# Patient Record
Sex: Male | Born: 1965 | Race: Black or African American | Hispanic: No | Marital: Married | Smoking: Never smoker
Health system: Southern US, Community
[De-identification: ages and names within clinical notes are randomized; demographics above are authoritative.]

## PROBLEM LIST (undated history)

## (undated) DIAGNOSIS — I10 Essential (primary) hypertension: Secondary | ICD-10-CM

## (undated) HISTORY — PX: CIRCUMCISION: SUR203

---

## 1999-07-21 ENCOUNTER — Emergency Department (HOSPITAL_COMMUNITY): Admission: EM | Admit: 1999-07-21 | Discharge: 1999-07-21 | Payer: Self-pay | Admitting: Emergency Medicine

## 1999-07-21 ENCOUNTER — Encounter: Payer: Self-pay | Admitting: Emergency Medicine

## 2003-05-23 ENCOUNTER — Emergency Department (HOSPITAL_COMMUNITY): Admission: AD | Admit: 2003-05-23 | Discharge: 2003-05-23 | Payer: Self-pay | Admitting: Family Medicine

## 2006-01-18 ENCOUNTER — Emergency Department (HOSPITAL_COMMUNITY): Admission: EM | Admit: 2006-01-18 | Discharge: 2006-01-18 | Payer: Self-pay | Admitting: Emergency Medicine

## 2006-01-18 ENCOUNTER — Ambulatory Visit: Payer: Self-pay | Admitting: Family Medicine

## 2006-01-23 ENCOUNTER — Ambulatory Visit: Payer: Self-pay | Admitting: Family Medicine

## 2006-02-06 ENCOUNTER — Ambulatory Visit: Payer: Self-pay | Admitting: Family Medicine

## 2006-02-16 ENCOUNTER — Ambulatory Visit: Payer: Self-pay | Admitting: Gastroenterology

## 2006-02-26 ENCOUNTER — Ambulatory Visit: Payer: Self-pay | Admitting: Gastroenterology

## 2006-03-20 ENCOUNTER — Ambulatory Visit: Payer: Self-pay | Admitting: Gastroenterology

## 2007-08-30 ENCOUNTER — Ambulatory Visit: Payer: Self-pay | Admitting: Family Medicine

## 2009-04-28 ENCOUNTER — Emergency Department (HOSPITAL_COMMUNITY): Admission: EM | Admit: 2009-04-28 | Discharge: 2009-04-28 | Payer: Self-pay | Admitting: Emergency Medicine

## 2009-07-08 ENCOUNTER — Ambulatory Visit: Payer: Self-pay | Admitting: Family Medicine

## 2010-05-19 ENCOUNTER — Ambulatory Visit: Payer: Self-pay | Admitting: Family Medicine

## 2010-10-06 LAB — POCT I-STAT, CHEM 8
BUN: 12 mg/dL (ref 6–23)
Calcium, Ion: 1.15 mmol/L (ref 1.12–1.32)
Chloride: 101 mEq/L (ref 96–112)
Creatinine, Ser: 1.4 mg/dL (ref 0.4–1.5)
Glucose, Bld: 85 mg/dL (ref 70–99)
HCT: 45 % (ref 39.0–52.0)
Hemoglobin: 15.3 g/dL (ref 13.0–17.0)
Potassium: 4 mEq/L (ref 3.5–5.1)
Sodium: 137 mEq/L (ref 135–145)
TCO2: 27 mmol/L (ref 0–100)

## 2012-03-13 ENCOUNTER — Telehealth: Payer: Self-pay | Admitting: Internal Medicine

## 2012-03-13 NOTE — Telephone Encounter (Signed)
If he thinks its gas or acid, can try Zantac and avoid gas causing foods.  Hard to say without more info.     Of note, he hasn't been seen here by me, and no visit in at least 1 year.  Recommend CPX in general.

## 2012-03-13 NOTE — Telephone Encounter (Signed)
Pt notified to avoid greasy food and to try zantac and if not working come in for a OV. Pt was also notified that it has been since 2011 since he was seen that he needs to make an CPE visit in general. Pt will look at work schedule and work something out.

## 2012-03-13 NOTE — Telephone Encounter (Signed)
pt states that he bloating up in the stomach area after about an hour of laying down and seems to have to get up and grab some water for the pressure in stomach to go down. Pt wants to know if there is anything over the counter he can try first.

## 2013-02-20 ENCOUNTER — Encounter (HOSPITAL_COMMUNITY): Payer: Self-pay

## 2013-02-20 ENCOUNTER — Emergency Department (HOSPITAL_COMMUNITY)
Admission: EM | Admit: 2013-02-20 | Discharge: 2013-02-21 | Disposition: A | Discharge status: Home / Self Care | Payer: BC Managed Care – PPO | Attending: Emergency Medicine | Admitting: Emergency Medicine

## 2013-02-20 ENCOUNTER — Emergency Department (HOSPITAL_COMMUNITY): Payer: BC Managed Care – PPO

## 2013-02-20 DIAGNOSIS — X503XXA Overexertion from repetitive movements, initial encounter: Secondary | ICD-10-CM | POA: Insufficient documentation

## 2013-02-20 DIAGNOSIS — Y929 Unspecified place or not applicable: Secondary | ICD-10-CM | POA: Insufficient documentation

## 2013-02-20 DIAGNOSIS — R209 Unspecified disturbances of skin sensation: Secondary | ICD-10-CM | POA: Insufficient documentation

## 2013-02-20 DIAGNOSIS — T733XXA Exhaustion due to excessive exertion, initial encounter: Secondary | ICD-10-CM | POA: Insufficient documentation

## 2013-02-20 DIAGNOSIS — M25551 Pain in right hip: Secondary | ICD-10-CM

## 2013-02-20 DIAGNOSIS — Y9389 Activity, other specified: Secondary | ICD-10-CM | POA: Insufficient documentation

## 2013-02-20 DIAGNOSIS — M25559 Pain in unspecified hip: Secondary | ICD-10-CM | POA: Insufficient documentation

## 2013-02-20 DIAGNOSIS — R609 Edema, unspecified: Secondary | ICD-10-CM | POA: Insufficient documentation

## 2013-02-20 DIAGNOSIS — X500XXA Overexertion from strenuous movement or load, initial encounter: Secondary | ICD-10-CM | POA: Insufficient documentation

## 2013-02-20 NOTE — ED Notes (Signed)
Pt states that he is having bilateral hip pain that started yesterday, he just got back from a long driving trip, no injury noted, he also complains of his fingers swelling

## 2013-02-20 NOTE — ED Provider Notes (Signed)
CSN: [id number]     Arrival date & time 02/20/13  2055 History   This chart was scribed for Raymon Mutton, PA, working with Marisa Severin, MD by Blanchard Kelch, ED Scribe. This patient was seen in room WTR9/WTR9 and the patient's care was started at 11:07 PM.    Chief Complaint  Patient presents with  . Hip Pain  . Joint Swelling    The history is provided by the patient. No language interpreter was used.    HPI Comments: Curtis Wallace is a 47 y.o. male who presents to the Emergency Department complaining of non-radiating, constant, aching bilateral hip pain that began today. Patient had a long trip recently to help his daughter move into her dorm and was driving for about 9.5 hours yesterday as well as for 13 hours four days ago, as well as lifting boxes to help daughter move into dorm room. Pain is worsened with standing and sitting movements. Patient complains of associated finger swelling and tingling.Patient has consumed 4 bottles of water today and is well hydrated. 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 Patient denies any recent falls or injuries. He denies any chest pain, shortness of breath or difficulty breathing, abdominal pain, urinary or bowel incontinence, stool changes, dizziness, headache, numbness, swelling or weakness. Patient denies smoking. His PCP is Dr. Susann Givens.   History reviewed. No pertinent past medical history. History reviewed. No pertinent past surgical history. History reviewed. No pertinent family history. History  Substance Use Topics  . Smoking status: Never Smoker   . Smokeless tobacco: Not on file  . Alcohol Use: No    Review of Systems  Respiratory: Negative for shortness of breath.        No difficulty breathing.  Cardiovascular: Negative for chest pain and leg swelling.  Gastrointestinal: Negative for abdominal pain, diarrhea and constipation.  Genitourinary: Negative for dysuria and difficulty urinating.   Musculoskeletal: Negative for joint swelling.  Neurological: Negative for dizziness, weakness, numbness and headaches.       Tingling present in fingers.  All other systems reviewed and are negative.    Allergies  Review of patient's allergies indicates no known allergies.  Home Medications   Current Outpatient Rx  Name  Route  Sig  Dispense  Refill  . methocarbamol (ROBAXIN) 500 MG tablet   Oral   Take 1 tablet (500 mg total) by mouth 2 (two) times daily.   20 tablet   0   . naproxen (NAPROSYN) 500 MG tablet   Oral   Take 1 tablet (500 mg total) by mouth 2 (two) times daily.   30 tablet   0     Triage Vitals: BP 139/82  Pulse 74  Temp(Src) 98.7 F (37.1 C) (Oral)  Resp 20  SpO2 98%  Physical Exam  Nursing note and vitals reviewed. Constitutional: He is oriented to person, place, and time. He appears well-developed and well-nourished. No distress.  HENT:  Head: Normocephalic and atraumatic.  Mouth/Throat: Oropharynx is clear and moist. No oropharyngeal exudate.  Eyes: Conjunctivae and EOM are normal. Pupils are equal, round, and reactive to light. Right eye exhibits no discharge. Left eye exhibits no discharge.  Neck: Normal range of motion. Neck supple.  Negative neck stiffness Negative nuchal rigidity Negative pain upon palpation cervical spine  Cardiovascular: Normal rate, regular rhythm and normal heart sounds.  Exam reveals no friction rub.   No murmur heard. Pulses:      Radial pulses  are 2+ on the right side, and 2+ on the left side.       Dorsalis pedis pulses are 2+ on the right side, and 2+ on the left side.  Pulmonary/Chest: Effort normal and breath sounds normal. No respiratory distress. He has no wheezes. He has no rales.  Musculoskeletal: Normal range of motion. He exhibits no tenderness.  Full flexion, extension, abduction, abduction, inversion eversion of the hips bilaterally without difficulty or pain identified. Discomfort noted when patient  flexed his back, attempt to touch toes with fingers localized to bilateral iliac crests.  Lymphadenopathy:    He has no cervical adenopathy.  Neurological: He is alert and oriented to person, place, and time. He exhibits normal muscle tone. Coordination normal.  Strength 5+/5+ with resistance to lower extremities bilaterally  Skin: Skin is warm and dry. No rash noted. He is not diaphoretic. No erythema.  Psychiatric: He has a normal mood and affect. His behavior is normal. Thought content normal.    ED Course   DIAGNOSTIC STUDIES:   Oxygen Saturation is 98% on room air, normal by my interpretation.    COORDINATION OF CARE:    Procedures (including critical care time)  Labs Reviewed - No data to display Dg Pelvis 1-2 Views  02/20/2013   *RADIOLOGY REPORT*  Clinical Data: Sudden onset of left pelvic pain radiating to the right side.  No known injury.  PELVIS - 1-2 VIEW  Comparison: None.  Findings: There is mild hypertrophic degenerative change in the left acetabular joint with small cyst in the acetabular roof on the left.  This is likely a degenerative cyst.  Mild degenerative changes in the right hip with prominent acetabular osteophyte.  No evidence of acute fracture or dislocation of the pelvis.  The SI joints, symphysis pubis, and pelvic rim are not displaced.  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  No displaced fractures identified.   Original Report Authenticated By: Burman Nieves, M.D.   1. Hip pain, bilateral     MDM  I personally performed the services described in this documentation, which was scribed in my presence. The recorded information has been reviewed and is accurate.  Patient presenting to emergency department with bilateral hip pain that has been ongoing starting today. Patient reported that he's been traveling over the weekend via car, reported that he traveled to [place] on Saturday  approximately 13 hours and Sunday 9.5 hours. Patient reported that the pain is constant aching sensation. Alert and oriented. Full range of motion to bilateral lower extremities, strength intact equally. Negative pain upon palpation to the bilateral hip and pelvic region. Discomfort noted when patient bends forward in an attempt to touch his toes with his fingers.  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 Patient stable, afebrile. Suspicion to be high of arthritic changes associated with mild arthritic flareup with suspicion of muscular discomfort. Possible inflammatory process of the iliac crests. Discharged patient with anti-inflammatories and muscle relaxants. Referred patient to orthopedics and patient to primary care provider. Discussed with patient to rest and stay hydrated. Discussed with patient to avoid strenuous activity or physical activity. Discussed with patient to continue monitor symptoms and if symptoms are to worsen or change to report back to emergency department-strict return instructions given. Patient agreed to plan of care, understood, all questions answered.  [employer],  PA-C 02/22/13 1112

## 2013-02-21 MED ORDER — METHOCARBAMOL 500 MG PO TABS: 500.0000 mg | ORAL_TABLET | Freq: Two times a day (BID) | ORAL | Status: DC

## 2013-02-21 MED ORDER — NAPROXEN 500 MG PO TABS: 500.0000 mg | ORAL_TABLET | Freq: Two times a day (BID) | ORAL | Status: DC

## 2013-02-24 NOTE — ED Provider Notes (Signed)
Medical screening examination/treatment/procedure(s) were performed by non-physician practitioner and as supervising physician I was immediately available for consultation/collaboration.  Derward Marple M Pierson Vantol, MD 02/24/13 2051 

## 2014-09-23 ENCOUNTER — Ambulatory Visit (INDEPENDENT_AMBULATORY_CARE_PROVIDER_SITE_OTHER): Payer: BLUE CROSS/BLUE SHIELD | Admitting: Family Medicine

## 2014-09-23 ENCOUNTER — Encounter: Payer: Self-pay | Admitting: Family Medicine

## 2014-09-23 VITALS — BP 120/66 | HR 80

## 2014-09-23 DIAGNOSIS — R350 Frequency of micturition: Secondary | ICD-10-CM | POA: Diagnosis not present

## 2014-09-23 DIAGNOSIS — N410 Acute prostatitis: Secondary | ICD-10-CM

## 2014-09-23 DIAGNOSIS — M461 Sacroiliitis, not elsewhere classified: Secondary | ICD-10-CM

## 2014-09-23 LAB — POCT URINALYSIS DIPSTICK
BILIRUBIN UA: NEGATIVE
Blood, UA: NEGATIVE
GLUCOSE UA: NEGATIVE
KETONES UA: NEGATIVE
LEUKOCYTES UA: NEGATIVE
Nitrite, UA: NEGATIVE
PROTEIN UA: NEGATIVE
Spec Grav, UA: 1.025
Urobilinogen, UA: NEGATIVE
pH, UA: 6

## 2014-09-23 MED ORDER — CIPROFLOXACIN HCL 500 MG PO TABS
500.0000 mg | ORAL_TABLET | Freq: Two times a day (BID) | ORAL | Status: DC
Start: 2014-09-23 — End: 2015-11-18

## 2014-09-23 NOTE — Progress Notes (Signed)
   Subjective:    Patient ID: Curtis Wallace L Culotta, male    DOB: [birth year], 49 y.o.   MRN: [id number]  HPI He has had difficulty over the last year with intermittent urinary urgency and occasional incontinence. He is also noted a decreased stream. No fever, chills, abdominal or perineal discomfort. He also has a history of previous urologic surgery however he is unsure exactly what it was.He did note that he had to use a catheter for short period of time. He also complains of right lower quadrant and back pain. He notes that this is especially worse when he tries. He does wear his bill fold in the back right hip pocket. No numbness, tingling or weakness.   Review of Systems     Objective:   Physical Exam ack exam does show some slight tenderness over the right lower SI joint.Pearlean BrownieFaber testing was positive. Good hip motion. Genitalia normal. Rectal exam does show a slightly tender but normal sized prostate. Urinalysis is negative.       Assessment & Plan:  Frequent urination - Plan: POCT Urinalysis Dipstick  Sacroiliitis  Acute prostatitis - Plan: ciprofloxacin (CIPRO) 500 MG tablet recommend stretching exercises for the sacroiliitis. It was demonstrated to him. I will also treat him like he has a prostate infection although his previous urologic surgery makes me wonder whether this is a urethral stricture issue or possibly bladder. If he continues have difficulty, urologic referral will be made.

## 2014-10-15 ENCOUNTER — Telehealth: Payer: Self-pay | Admitting: Family Medicine

## 2014-10-15 MED ORDER — SILDENAFIL CITRATE 100 MG PO TABS: 50.0000 mg | ORAL_TABLET | Freq: Every day | ORAL | Status: DC | PRN

## 2014-10-15 NOTE — Telephone Encounter (Signed)
He called indicating he is having some slight erectile dysfunction. I will give him samples of Viagra with instructions on use. Also discussed possible side effects. He will let me know how he is doing.

## 2014-10-15 NOTE — Telephone Encounter (Signed)
Pt called to let Dr Susann GivensLalonde know that antibiotic is working well however he does need to discuss something else or run something else by him.

## 2015-11-18 ENCOUNTER — Ambulatory Visit (INDEPENDENT_AMBULATORY_CARE_PROVIDER_SITE_OTHER): Payer: Managed Care, Other (non HMO) | Admitting: Family Medicine

## 2015-11-18 ENCOUNTER — Encounter: Payer: Self-pay | Admitting: Family Medicine

## 2015-11-18 VITALS — BP 130/90 | HR 90 | Wt 225.0 lb

## 2015-11-18 DIAGNOSIS — N529 Male erectile dysfunction, unspecified: Secondary | ICD-10-CM | POA: Diagnosis not present

## 2015-11-18 MED ORDER — SILDENAFIL CITRATE 100 MG PO TABS: 50.0000 mg | ORAL_TABLET | Freq: Every day | ORAL | Status: DC | PRN

## 2015-11-18 NOTE — Progress Notes (Signed)
   Subjective:    Patient ID: Curtis Wallace, male    DOB: Dec 15, 1965, 50 y.o.   MRN: [id number]  HPI  he is here for consult concerning erectile dysfunction. He has used Viagra in the past and uses this very sparingly. He notes intermittent difficulty with this. He does not smoke, drinking is minimal. He is on no meds that should interfere.   Review of Systems     Objective:   Physical Exam   Alert and in no distress otherwise not examined.       Assessment & Plan:  Erectile dysfunction, unspecified erectile dysfunction type - Plan: sildenafil (VIAGRA) 100 MG tablet  I discussed the use of Viagra  As well as risks and benefits

## 2016-02-10 ENCOUNTER — Encounter: Payer: Self-pay | Admitting: Gastroenterology

## 2019-01-30 ENCOUNTER — Encounter (HOSPITAL_COMMUNITY): Payer: Self-pay

## 2019-01-30 ENCOUNTER — Other Ambulatory Visit: Payer: Self-pay

## 2019-01-30 ENCOUNTER — Ambulatory Visit (HOSPITAL_COMMUNITY)
Admission: EM | Admit: 2019-01-30 | Discharge: 2019-01-30 | Disposition: A | Discharge status: Home / Self Care | Payer: Self-pay | Attending: Family Medicine | Admitting: Family Medicine

## 2019-01-30 DIAGNOSIS — F419 Anxiety disorder, unspecified: Secondary | ICD-10-CM

## 2019-01-30 DIAGNOSIS — T61774A Other fish poisoning, undetermined, initial encounter: Secondary | ICD-10-CM

## 2019-01-30 MED ORDER — HYDROXYZINE HCL 25 MG PO TABS: 25.0000 mg | ORAL_TABLET | Freq: Four times a day (QID) | ORAL | 0 refills | Status: DC

## 2019-01-30 NOTE — Discharge Instructions (Addendum)
I believe that you symptoms are related to fish poisoning Symptoms of this could last a few days. I will prescribe some hydroxyzine to take for acute anxiety. Make sure you are drinking plenty of fluids and staying hydrated Follow up as needed for continued or worsening symptoms

## 2019-01-30 NOTE — ED Provider Notes (Addendum)
[facility]    CSN: [id number] Arrival date & time: 01/30/19  5366     History   Chief Complaint Chief Complaint  Patient presents with  . Anxiety    HPI Curtis Wallace is a 53 y.o. male.   Patient is an otherwise healthy 53 year old male that presents today with anxiety.  Reporting this started approximately 2 days ago.  He was eating some salmon and got into the second piece when he suddenly started to feel his heart racing, flushing all over and very anxious.  He immediately had nausea and started vomiting.  He vomited all of the food up.  Earlier that day he also had eaten some tuna that possibly could have been old.  There was a darkened spot on it.  He called 911 that evening and the ambulance came and evaluated him.  They gave him Zofran for nausea which helped.  His vital signs at that time were stable and they were not concerned.  Since he has had intermittent nervousness, restlessness, mind racing.  He has been having trouble sleeping.  He went to the pharmacy and they recommended taking Z quill or Benadryl to help him rest.  He reports this somewhat helped.  He is just here today with concerns that the symptoms still remain although they have improved.  He no longer has any nausea, vomiting is been holding down food and fluids.  He is been drinking Gatorade and eating soup.  Denies any history of anxiety.  Denies any chest pain, shortness of breath, abdominal pain, fevers.   ROS per HPI      History reviewed. No pertinent past medical history.  There are no active problems to display for this patient.   History reviewed. No pertinent surgical history.     Home Medications    Prior to Admission medications   Medication Sig Start Date End Date Taking? Authorizing Provider  hydrOXYzine (ATARAX/VISTARIL) 25 MG tablet Take 1 tablet (25 mg total) by mouth every 6 (six) hours. 01/30/19   Loura Halt A, NP  sildenafil (VIAGRA) 100 MG tablet Take 0.5-1 tablets  (50-100 mg total) by mouth daily as needed for erectile dysfunction. 11/18/15   Denita Lung, MD    Family History Family History  Family history unknown: Yes    Social History Social History   Tobacco Use  . Smoking status: Never Smoker  Substance Use Topics  . Alcohol use: No  . Drug use: Not on file     Allergies   Patient has no known allergies.   Review of Systems Review of Systems   Physical Exam Triage Vital Signs ED Triage Vitals  Enc Vitals Group     BP 01/30/19 0934 (!) 152/101     Pulse Rate 01/30/19 0934 72     Resp 01/30/19 0934 20     Temp 01/30/19 0934 98.9 F (37.2 C)     Temp Source 01/30/19 0934 Oral     SpO2 01/30/19 0934 94 %     Weight --      Height --      Head Circumference --      Peak Flow --      Pain Score 01/30/19 0935 0     Pain Loc --      Pain Edu? --      Excl. in [place]? --    No data found.  Updated Vital Signs BP (!) 152/101 (BP Location: Right Arm)   Pulse 72  Temp 98.9 F (37.2 C) (Oral)   Resp 20   SpO2 94%   Visual Acuity Right Eye Distance:   Left Eye Distance:   Bilateral Distance:    Right Eye Near:   Left Eye Near:    Bilateral Near:     Physical Exam Vitals signs and nursing note reviewed.  Constitutional:      General: He is not in acute distress.    Appearance: Normal appearance. He is not ill-appearing, toxic-appearing or diaphoretic.  HENT:     Head: Normocephalic and atraumatic.     Mouth/Throat:     Pharynx: Oropharynx is clear.  Eyes:     Extraocular Movements: Extraocular movements intact.     Conjunctiva/sclera: Conjunctivae normal.     Pupils: Pupils are equal, round, and reactive to light.  Neck:     Musculoskeletal: Normal range of motion.  Cardiovascular:     Rate and Rhythm: Normal rate and regular rhythm.  Pulmonary:     Effort: Pulmonary effort is normal.     Breath sounds: Normal breath sounds.  Musculoskeletal: Normal range of motion.  Skin:    General: Skin is warm  and dry.  Neurological:     General: No focal deficit present.     Mental Status: He is alert.  Psychiatric:        Mood and Affect: Mood normal.     Comments: Mildly anxious      UC Treatments / Results  Labs (all labs ordered are listed, but only abnormal results are displayed) Labs Reviewed - No data to display  EKG   Radiology No results found.  Procedures Procedures (including critical care time)  Medications Ordered in UC Medications - No data to display  Initial Impression / Assessment and Plan / UC Course  I have reviewed the triage vital signs and the nursing notes.  Pertinent labs & imaging results that were available during my care of the patient were reviewed by me and considered in my medical decision making (see chart for details).     Exam normal.  Patient's vital signs are stable and he is nontoxic or ill-appearing. Appears to be male anxious Based on series of events most likely diagnosis is either food poisoning or fish poisoning. His symptoms are improving but he is still having some anxiety about the way he feels. We will try hydroxyzine to see if this helps calm his anxiety Otherwise if his symptoms continue he will need more of a work-up. Offered patient EKG here today but refused  Final Clinical Impressions(s) / UC Diagnoses   Final diagnoses:  Anxiety  Fish poisoning, undetermined intent, initial encounter     Discharge Instructions     I believe that you symptoms are related to fish poisoning Symptoms of this could last a few days. I will prescribe some hydroxyzine to take for acute anxiety. Make sure you are drinking plenty of fluids and staying hydrated Follow up as needed for continued or worsening symptoms     ED Prescriptions    Medication Sig Dispense Auth. Provider   hydrOXYzine (ATARAX/VISTARIL) 25 MG tablet Take 1 tablet (25 mg total) by mouth every 6 (six) hours. 12 tablet Dahlia ByesBast, Janesa Dockery A, NP     Controlled Substance  Prescriptions [place] Controlled Substance Registry consulted? Not Applicable       Janace ArisBast, Deepak Bless A, NP 01/30/19 1113

## 2019-01-30 NOTE — ED Triage Notes (Signed)
Pt presents with anxiety; mind racing, nervousness, and feeling restless X 2 days.

## 2019-02-08 ENCOUNTER — Ambulatory Visit (HOSPITAL_COMMUNITY)
Admission: EM | Admit: 2019-02-08 | Discharge: 2019-02-08 | Disposition: A | Discharge status: Home / Self Care | Payer: Self-pay | Attending: Family Medicine | Admitting: Family Medicine

## 2019-02-08 ENCOUNTER — Other Ambulatory Visit: Payer: Self-pay

## 2019-02-08 ENCOUNTER — Encounter (HOSPITAL_COMMUNITY): Payer: Self-pay

## 2019-02-08 DIAGNOSIS — R002 Palpitations: Secondary | ICD-10-CM

## 2019-02-08 DIAGNOSIS — R0789 Other chest pain: Secondary | ICD-10-CM

## 2019-02-08 MED ORDER — ALUM & MAG HYDROXIDE-SIMETH 200-200-20 MG/5ML PO SUSP
30.0000 mL | Freq: Once | ORAL | Status: AC
  Administered 2019-02-08: 19:00:00 30 mL via ORAL

## 2019-02-08 MED ORDER — LIDOCAINE VISCOUS HCL 2 % MT SOLN
15.0000 mL | Freq: Once | OROMUCOSAL | Status: AC
  Administered 2019-02-08: 15 mL via ORAL

## 2019-02-08 MED ORDER — ALUM & MAG HYDROXIDE-SIMETH 200-200-20 MG/5ML PO SUSP
ORAL | Status: AC
  Filled 2019-02-08: qty 30

## 2019-02-08 MED ORDER — LIDOCAINE VISCOUS HCL 2 % MT SOLN
OROMUCOSAL | Status: AC
  Filled 2019-02-08: qty 15

## 2019-02-08 NOTE — Discharge Instructions (Signed)
Hold off on further CBD oil Use hydroxyzine as needed at bedtime  May start 2 week trial of omeprazole or use maalox as needed for further gas/pressure/ reflux symptoms  Follow up with cardiology if continuing to have palpitations at night time

## 2019-02-08 NOTE — ED Triage Notes (Signed)
Pt present increase heart rate from took a supplement of CBD oil. Pt states after taking supplement his heart begin to race.

## 2019-02-09 NOTE — ED Provider Notes (Signed)
[facility]    CSN: [id number] Arrival date & time: 02/08/19  1736      History   Chief Complaint Chief Complaint  Patient presents with  . Irregular Heart Beat    HPI Curtis Wallace is a 53 y.o. male no significant past medical history presenting today for evaluation of palpitations.  Patient states 2 weeks ago he believes he had food poisoning from a fish.  He had a lot of nausea and vomiting.  This has resolved, and has returned to normal oral intake.  He was seen here and given hydroxyzine to help with anxiety and insomnia at nighttime.  This has helped him.  In order to avoid using medicines he tried using CBD oil on Tuesday.  Afterward he felt his heart pounding when he would lay down at nighttime.  This has continued despite stopping using the CBD oil.  He only notices this when he lays down and at nighttime when he is relaxing.  Denies symptoms during the day.  Denies associated shortness of breath.  Denies chest pain, but does have a slight chest pressure/gas sensation just to the left of his lower sternum.  He notes that he has checked his watch when he has felt his heart pounding and states that it will go up close to 100.  Typical heart rate is around 65-80.  Denies history of hypertension, diabetes, tobacco use.  Denies leg pain or leg swelling.  Denies previous DVT/PE.  He notes that he played tennis earlier today and felt normal.  HPI  History reviewed. No pertinent past medical history.  There are no active problems to display for this patient.   History reviewed. No pertinent surgical history.     Home Medications    Prior to Admission medications   Medication Sig Start Date End Date Taking? Authorizing Provider  hydrOXYzine (ATARAX/VISTARIL) 25 MG tablet Take 1 tablet (25 mg total) by mouth every 6 (six) hours. 01/30/19   Dahlia ByesBast, Traci A, NP  sildenafil (VIAGRA) 100 MG tablet Take 0.5-1 tablets (50-100 mg total) by mouth daily as needed for erectile  dysfunction. 11/18/15   Ronnald NianLalonde, John C, MD    Family History Family History  Family history unknown: Yes    Social History Social History   Tobacco Use  . Smoking status: Never Smoker  Substance Use Topics  . Alcohol use: No  . Drug use: Not on file     Allergies   Patient has no known allergies.   Review of Systems Review of Systems  Constitutional: Negative for fatigue and fever.  HENT: Negative for congestion, sinus pressure and sore throat.   Eyes: Negative for photophobia, pain and visual disturbance.  Respiratory: Negative for cough and shortness of breath.   Cardiovascular: Positive for palpitations. Negative for chest pain.  Gastrointestinal: Negative for abdominal pain, nausea and vomiting.  Genitourinary: Negative for decreased urine volume and hematuria.  Musculoskeletal: Negative for myalgias, neck pain and neck stiffness.  Neurological: Negative for dizziness, syncope, facial asymmetry, speech difficulty, weakness, light-headedness, numbness and headaches.     Physical Exam Triage Vital Signs ED Triage Vitals  Enc Vitals Group     BP 02/08/19 1815 (!) 172/89     Pulse Rate 02/08/19 1815 90     Resp 02/08/19 1815 16     Temp 02/08/19 1815 98.1 F (36.7 C)     Temp Source 02/08/19 1815 Oral     SpO2 02/08/19 1815 98 %  Weight --      Height --      Head Circumference --      Peak Flow --      Pain Score 02/08/19 1816 0     Pain Loc --      Pain Edu? --      Excl. in [place]? --    No data found.  Updated Vital Signs BP (!) 172/89 (BP Location: Left Arm)   Pulse 90   Temp 98.1 F (36.7 C) (Oral)   Resp 16   SpO2 98%   Visual Acuity Right Eye Distance:   Left Eye Distance:   Bilateral Distance:    Right Eye Near:   Left Eye Near:    Bilateral Near:     Physical Exam Vitals signs and nursing note reviewed.  Constitutional:      Appearance: He is well-developed.  HENT:     Head: Normocephalic and atraumatic.     Mouth/Throat:      Comments: Oral mucosa pink and moist, no tonsillar enlargement or exudate. Posterior pharynx patent and nonerythematous, no uvula deviation or swelling. Normal phonation. Palate elevates symmetrically Eyes:     Extraocular Movements: Extraocular movements intact.     Conjunctiva/sclera: Conjunctivae normal.     Pupils: Pupils are equal, round, and reactive to light.  Neck:     Musculoskeletal: Neck supple.  Cardiovascular:     Rate and Rhythm: Normal rate and regular rhythm.     Heart sounds: No murmur.     Comments: No carotid bruits auscultated Pulmonary:     Effort: Pulmonary effort is normal. No respiratory distress.     Breath sounds: Normal breath sounds.     Comments: Breathing comfortably at rest, CTABL, no wheezing, rales or other adventitious sounds auscultated Abdominal:     Palpations: Abdomen is soft.     Tenderness: There is no abdominal tenderness.  Musculoskeletal:     Comments: Bilateral lower leg symmetric  Skin:    General: Skin is warm and dry.  Neurological:     General: No focal deficit present.     Mental Status: He is alert and oriented to person, place, and time. Mental status is at baseline.     Comments: Patient A&O x3, cranial nerves II-XII grossly intact, strength at shoulders, hips and knees 5/5, equal bilaterally, patellar reflex 2+ bilaterally.Gait without abnormality.      UC Treatments / Results  Labs (all labs ordered are listed, but only abnormal results are displayed) Labs Reviewed - No data to display  EKG   Radiology No results found.  Procedures Procedures (including critical care time)  Medications Ordered in UC Medications  alum & mag hydroxide-simeth (MAALOX/MYLANTA) 200-200-20 MG/5ML suspension 30 mL (30 mLs Oral Given 02/08/19 1858)    And  lidocaine (XYLOCAINE) 2 % viscous mouth solution 15 mL (15 mLs Oral Given 02/08/19 1858)  alum & mag hydroxide-simeth (MAALOX/MYLANTA) 200-200-20 MG/5ML suspension (has no administration in  time range)  lidocaine (XYLOCAINE) 2 % viscous mouth solution (has no administration in time range)    Initial Impression / Assessment and Plan / UC Course  I have reviewed the triage vital signs and the nursing notes.  Pertinent labs & imaging results that were available during my care of the patient were reviewed by me and considered in my medical decision making (see chart for details).  Clinical Course as of Feb 09 1027  Sat Feb 08, 2019  1842 174/94 BP recheck   [HW]  1854 147/94 recheck- above entered in error   [HW]    Clinical Course User Index [HW] ,  C, PA-C    EKG normal sinus rhythm, does have nonspecific T wave flattening in 2 3 and aVF.  No acute signs of ischemia or infarction, currently asymptomatic.  Blood pressure rechecked and was much better than initial reading.  Given symptoms mainly at nighttime may be more palpitations and less distractions noticeable of heart rate.  Provided GI cocktail which did relieve pressure/gas sensation.  Recommended possible trial of omeprazole x2 weeks to calm down any acid from recent GI illness with a lot of nausea and vomiting.  Recommended following up with cardiology outpatient, continue hydroxyzine as needed at nighttime for anxiety.  Follow-up if symptoms changing, progressing to daytime, developing chest pain or shortness of breath.Discussed strict return precautions. Patient verbalized understanding and is agreeable with plan.  Final Clinical Impressions(s) / UC Diagnoses   Final diagnoses:  Palpitations  Atypical chest pain     Discharge Instructions     Hold off on further CBD oil Use hydroxyzine as needed at bedtime  May start 2 week trial of omeprazole or use maalox as needed for further gas/pressure/ reflux symptoms  Follow up with cardiology if continuing to have palpitations at night time   ED Prescriptions    None     Controlled Substance Prescriptions [place] Controlled Substance Registry  consulted? Not Applicable   Lew Dawes,  C, [place]PA-C 02/09/19 [phone]

## 2019-02-12 ENCOUNTER — Telehealth: Payer: Self-pay

## 2019-02-12 NOTE — Telephone Encounter (Signed)
NOTES ON FILE FROM THE ED, SENT REFERRAL TO SCHEDULING 

## 2019-02-15 ENCOUNTER — Other Ambulatory Visit: Payer: Self-pay

## 2019-02-15 ENCOUNTER — Ambulatory Visit (HOSPITAL_COMMUNITY)
Admission: EM | Admit: 2019-02-15 | Discharge: 2019-02-15 | Disposition: A | Discharge status: Home / Self Care | Payer: Self-pay | Attending: Urgent Care | Admitting: Urgent Care

## 2019-02-15 DIAGNOSIS — R030 Elevated blood-pressure reading, without diagnosis of hypertension: Secondary | ICD-10-CM

## 2019-02-15 DIAGNOSIS — I10 Essential (primary) hypertension: Secondary | ICD-10-CM

## 2019-02-15 DIAGNOSIS — R0789 Other chest pain: Secondary | ICD-10-CM

## 2019-02-15 DIAGNOSIS — R000 Tachycardia, unspecified: Secondary | ICD-10-CM

## 2019-02-15 MED ORDER — METOPROLOL TARTRATE 25 MG PO TABS: 25.0000 mg | ORAL_TABLET | Freq: Two times a day (BID) | ORAL | 0 refills | Status: DC

## 2019-02-15 MED ORDER — AMLODIPINE BESYLATE 5 MG PO TABS: 5.0000 mg | ORAL_TABLET | Freq: Every day | ORAL | 0 refills | Status: DC

## 2019-02-15 MED ORDER — HYDROXYZINE HCL 25 MG PO TABS: 25.0000 mg | ORAL_TABLET | Freq: Three times a day (TID) | ORAL | 0 refills | Status: DC | PRN

## 2019-02-15 NOTE — ED Provider Notes (Signed)
MRN: [id number] DOB: [birth year]  Subjective:   Curtis Wallace is a 53 y.o. male presenting for 1 day history of recurrent mid lower chest pressure, heart racing and palpitations.  Patient insists that he does not have chest pain just pressure and can be elicited with taking a deep breath.  States that he generally feels okay but once he starts to think about things that worry him the chest pressure comes back on.  At his last office visit at our clinic, he had a reassuring EKG.  He use hydroxyzine as prescribed which has helped.  He is also taking omeprazole to address acid reflux as a source of his symptoms.  However, he did not reach out to a cardiologist.  No current facility-administered medications for this encounter.   Current Outpatient Medications:  .  hydrOXYzine (ATARAX/VISTARIL) 25 MG tablet, Take 1 tablet (25 mg total) by mouth every 6 (six) hours., Disp: 12 tablet, Rfl: 0 .  sildenafil (VIAGRA) 100 MG tablet, Take 0.5-1 tablets (50-100 mg total) by mouth daily as needed for erectile dysfunction., Disp: 10 tablet, Rfl: 5   No Known Allergies  Denies past medical history, past surgical history.  ROS  Objective:   Vitals: BP (!) 158/100 (BP Location: Right Arm)   Pulse (!) 107   Temp 99 F (37.2 C) (Oral)   Resp 20   SpO2 100%   Physical Exam Constitutional:      General: He is not in acute distress.    Appearance: Normal appearance. He is well-developed. He is not ill-appearing, toxic-appearing or diaphoretic.  HENT:     Head: Normocephalic and atraumatic.     Right Ear: External ear normal.     Left Ear: External ear normal.     Nose: Nose normal.     Mouth/Throat:     Mouth: Mucous membranes are moist.     Pharynx: Oropharynx is clear.  Eyes:     General: No scleral icterus.    Extraocular Movements: Extraocular movements intact.     Pupils: Pupils are equal, round, and reactive to light.  Cardiovascular:     Rate and Rhythm: Normal rate and regular rhythm.      Heart sounds: Normal heart sounds. No murmur. No friction rub. No gallop.   Pulmonary:     Effort: Pulmonary effort is normal. No respiratory distress.     Breath sounds: Normal breath sounds. No stridor. No wheezing, rhonchi or rales.  Neurological:     Mental Status: He is alert and oriented to person, place, and time.  Psychiatric:        Mood and Affect: Mood normal.        Behavior: Behavior normal.        Thought Content: Thought content normal.        Judgment: Judgment normal.     ED ECG REPORT   Date: 02/15/2019  Rate: 101bpm  Rhythm: sinus tachycardia  QRS Axis: normal  Intervals: normal  ST/T Wave abnormalities: nonspecific T wave changes  Conduction Disutrbances:none  Narrative Interpretation: T wave flattening in lead III, aVL, V1 which is very comparable to previous EKG from 02/08/2019.  Old EKG Reviewed: unchanged  I have personally reviewed the EKG tracing and agree with the computerized printout as noted.   Assessment and Plan :   1. Chest pressure   2. Essential hypertension   3. Elevated blood pressure reading   4. Tachycardia   5. Atypical chest pain     I have  low suspicion for ACS, ECG is reassuring and is very comparable to previous EKG about a week ago.  We will have patient start both amlodipine and metoprolol to address his high blood pressure readings and tachycardia.  Emphasized need to obtain consult with cardiology, information provided to patient.  Strict ER precautions.  Avoid any use of Viagra until cardiologist evaluates patient.  Maintain healthy diet, omeprazole.  Refilled hydroxyzine to address his anxiety. Counseled patient on potential for adverse effects with medications prescribed/recommended today, ER and return-to-clinic precautions discussed, patient verbalized understanding.     Wallis BambergMani, Staisha Winiarski, PA-C 02/15/19 1426

## 2019-02-15 NOTE — ED Triage Notes (Signed)
Pt present rapid heartbeat with some anxiety. Symptoms started last night pt was unable  To sleep because of the heart rate.

## 2019-02-15 NOTE — Discharge Instructions (Addendum)

## 2019-02-21 ENCOUNTER — Other Ambulatory Visit: Payer: Self-pay

## 2019-02-21 ENCOUNTER — Emergency Department (HOSPITAL_COMMUNITY)
Admission: EM | Admit: 2019-02-21 | Discharge: 2019-02-21 | Disposition: A | Discharge status: Home / Self Care | Payer: Managed Care, Other (non HMO) | Attending: Emergency Medicine | Admitting: Emergency Medicine

## 2019-02-21 ENCOUNTER — Encounter (HOSPITAL_COMMUNITY): Payer: Self-pay | Admitting: Emergency Medicine

## 2019-02-21 ENCOUNTER — Emergency Department (HOSPITAL_COMMUNITY): Payer: Managed Care, Other (non HMO)

## 2019-02-21 DIAGNOSIS — Z79899 Other long term (current) drug therapy: Secondary | ICD-10-CM | POA: Insufficient documentation

## 2019-02-21 DIAGNOSIS — R002 Palpitations: Secondary | ICD-10-CM

## 2019-02-21 DIAGNOSIS — F419 Anxiety disorder, unspecified: Secondary | ICD-10-CM

## 2019-02-21 DIAGNOSIS — I10 Essential (primary) hypertension: Secondary | ICD-10-CM | POA: Insufficient documentation

## 2019-02-21 HISTORY — DX: Essential (primary) hypertension: I10

## 2019-02-21 LAB — URINALYSIS, ROUTINE W REFLEX MICROSCOPIC
Bilirubin Urine: NEGATIVE
Glucose, UA: NEGATIVE mg/dL
Hgb urine dipstick: NEGATIVE
Ketones, ur: NEGATIVE mg/dL
Leukocytes,Ua: NEGATIVE
Nitrite: NEGATIVE
Protein, ur: NEGATIVE mg/dL
Specific Gravity, Urine: 1.008 (ref 1.005–1.030)
pH: 6 (ref 5.0–8.0)

## 2019-02-21 LAB — TROPONIN I (HIGH SENSITIVITY)
Troponin I (High Sensitivity): 5 ng/L (ref <18)
Troponin I (High Sensitivity): 5 ng/L (ref <18)

## 2019-02-21 LAB — BASIC METABOLIC PANEL
Anion gap: 10 (ref 5–15)
BUN: 11 mg/dL (ref 6–20)
CO2: 25 mmol/L (ref 22–32)
Calcium: 9.5 mg/dL (ref 8.9–10.3)
Chloride: 103 mmol/L (ref 98–111)
Creatinine, Ser: 1.51 mg/dL — ABNORMAL HIGH (ref 0.61–1.24)
GFR calc Af Amer: 59 mL/min — ABNORMAL LOW (ref >60)
GFR calc non Af Amer: 51 mL/min — ABNORMAL LOW (ref >60)
Glucose, Bld: 112 mg/dL — ABNORMAL HIGH (ref 70–99)
Potassium: 4.3 mmol/L (ref 3.5–5.1)
Sodium: 138 mmol/L (ref 135–145)

## 2019-02-21 LAB — CBC
HCT: 46.6 % (ref 39.0–52.0)
Hemoglobin: 16.1 g/dL (ref 13.0–17.0)
MCH: 27.3 pg (ref 26.0–34.0)
MCHC: 34.5 g/dL (ref 30.0–36.0)
MCV: 79.1 fL — ABNORMAL LOW (ref 80.0–100.0)
Platelets: 289 10*3/uL (ref 150–400)
RBC: 5.89 MIL/uL — ABNORMAL HIGH (ref 4.22–5.81)
RDW: 13.2 % (ref 11.5–15.5)
WBC: 4.6 10*3/uL (ref 4.0–10.5)
nRBC: 0 % (ref 0.0–0.2)

## 2019-02-21 LAB — TSH: TSH: 1.408 u[IU]/mL (ref 0.350–4.500)

## 2019-02-21 MED ORDER — SODIUM CHLORIDE 0.9% FLUSH
3.0000 mL | Freq: Once | INTRAVENOUS | Status: AC
  Administered 2019-02-21: 17:00:00 3 mL via INTRAVENOUS

## 2019-02-21 NOTE — Discharge Instructions (Addendum)
Your work-up today was reassuring.  However, you will likely require further work-up of your symptoms.  You may benefit from having a Holter monitor.  I recommend that you follow-up with your regular doctor within the next week for reevaluation and to discuss possible further work-up.  You are also given information to follow-up with cardiology in regards to your symptoms.  Please monitor symptoms closely.  If you have any persistent palpitations, shortness of breath, chest pain, or any new or worsening symptoms then please return to the emergency department immediately.

## 2019-02-21 NOTE — ED Provider Notes (Signed)
[facility] Provider Note   CSN: [id number] Arrival date & time: 02/21/19  1311     History   Chief Complaint Chief Complaint  Patient presents with  . Palpitations    HPI Curtis Wallace. is a 53 y.o. male.     HPI   Patient is a 53 year old male with a history of hypertension who presents to the emergency department today for evaluation of palpitations.  Symptoms have been intermittent for the last 2 weeks.  Occur about every other day.  Episodes last for minutes and resolve on their own.  With the episodes he feels near syncopal but he has not had any actual syncopal episodes.  Denies associated diaphoresis, nausea or vomiting.  He does have some intermittent chest pressure but is not necessarily associated with the symptoms.  It is nonexertional.  States he has been present since he had many episodes of vomiting from food poisoning few weeks ago.  He has also noticed that his heart rate is fluctuating from the 50s to the 180s when these episodes occur.  He also notices that he has anxiety with this as well.  Of note, he states he has been seen in urgent care for his symptoms several times.  He has been started on amlodipine, metoprolol as well as hydroxyzine.  States initially the hydroxyzine was controlling his symptoms but this morning he took it and he still had an episode of palpitations made him concerned.  He came to the ED because he felt like he needed labs and further work-up.  He has not told his PCP about this but he does have a PCP.  No cough, persistent shortness nests of breath, pleuritic pain, fevers or other associated symptoms.  Denies drug or alcohol use although he did state that he used CBD oil once during this time.  Past Medical History:  Diagnosis Date  . Hypertension     There are no active problems to display for this patient.   Past Surgical History:  Procedure Laterality Date  . CIRCUMCISION           Home Medications    Prior to Admission medications   Medication Sig Start Date End Date Taking? Authorizing Provider  amLODipine (NORVASC) 5 MG tablet Take 1 tablet (5 mg total) by mouth daily. 02/15/19  Yes Jaynee Eagles, PA-C  hydrOXYzine (ATARAX/VISTARIL) 25 MG tablet Take 1 tablet (25 mg total) by mouth every 8 (eight) hours as needed. Patient taking differently: Take 25 mg by mouth every 8 (eight) hours as needed for anxiety.  02/15/19  Yes Jaynee Eagles, PA-C  metoprolol tartrate (LOPRESSOR) 25 MG tablet Take 1 tablet (25 mg total) by mouth 2 (two) times daily. 02/15/19  Yes Jaynee Eagles, PA-C  omeprazole (PRILOSEC) 40 MG capsule Take 40 mg by mouth daily.   Yes [provider]  sildenafil (VIAGRA) 100 MG tablet Take 0.5-1 tablets (50-100 mg total) by mouth daily as needed for erectile dysfunction. 11/18/15  Yes Denita Lung, MD  Simethicone (MAALOX ANTI-GAS PO) Take 20 mLs by mouth at bedtime.   Yes [provider]    Family History Family History  Family history unknown: Yes    Social History Social History   Tobacco Use  . Smoking status: Never Smoker  . Smokeless tobacco: Never Used  Substance Use Topics  . Alcohol use: Yes  . Drug use: Not Currently     Allergies   Patient has no known allergies.  Review of Systems Review of Systems  Constitutional: Negative for fever.  HENT: Negative for ear pain and sore throat.   Eyes: Negative for visual disturbance.  Respiratory: Negative for cough and shortness of breath.   Cardiovascular: Positive for palpitations. Negative for chest pain.       Chest pressure  Gastrointestinal: Negative for abdominal pain, constipation, diarrhea, nausea and vomiting.  Genitourinary: Negative for dysuria.  Musculoskeletal: Negative for back pain.  Skin: Negative for rash.  Neurological: Negative for headaches.  Psychiatric/Behavioral: The patient is nervous/anxious.   All other systems reviewed and are negative.    Physical Exam Updated Vital Signs BP 127/82   Pulse (!) 57   Temp 98.9 F (37.2 C) (Oral)   Resp 18   Ht 5' 11.5" (1.816 m)   Wt 103 kg   SpO2 99%   BMI 31.22 kg/m   Physical Exam Vitals signs and nursing note reviewed.  Constitutional:      General: He is not in acute distress.    Appearance: He is well-developed. He is not ill-appearing or toxic-appearing.  HENT:     Head: Normocephalic and atraumatic.  Eyes:     Conjunctiva/sclera: Conjunctivae normal.  Neck:     Musculoskeletal: Neck supple.  Cardiovascular:     Rate and Rhythm: Normal rate and regular rhythm.     Pulses: Normal pulses.     Heart sounds: Normal heart sounds. No murmur.  Pulmonary:     Effort: Pulmonary effort is normal. No respiratory distress.     Breath sounds: Normal breath sounds. No wheezing, rhonchi or rales.  Abdominal:     General: Bowel sounds are normal.     Palpations: Abdomen is soft.     Tenderness: There is no abdominal tenderness. There is no guarding or rebound.  Musculoskeletal:        General: No tenderness.     Right lower leg: No edema.     Left lower leg: No edema.  Skin:    General: Skin is warm and dry.  Neurological:     Mental Status: He is alert and oriented to person, place, and time.     Cranial Nerves: No cranial nerve deficit.     Sensory: No sensory deficit.     Motor: No weakness.     Coordination: Coordination normal.     Gait: Gait normal.  Psychiatric:     Comments: Appears anxious about sxs      ED Treatments / Results  Labs (all labs ordered are listed, but only abnormal results are displayed) Labs Reviewed  BASIC METABOLIC PANEL - Abnormal; Notable for the following components:      Result Value   Glucose, Bld 112 (*)    Creatinine, Ser 1.51 (*)    GFR calc non Af Amer 51 (*)    GFR calc Af Amer 59 (*)    All other components within normal limits  CBC - Abnormal; Notable for the following components:   RBC 5.89 (*)    MCV 79.1 (*)    All  other components within normal limits  URINALYSIS, ROUTINE W REFLEX MICROSCOPIC - Abnormal; Notable for the following components:   Color, Urine STRAW (*)    All other components within normal limits  TSH  TROPONIN I (HIGH SENSITIVITY)  TROPONIN I (HIGH SENSITIVITY)    EKG EKG Interpretation  Date/Time:  Friday February 21 2019 13:15:37 EDT Ventricular Rate:  91 PR Interval:  148 QRS Duration: 78 QT Interval:  344 QTC Calculation: 423 R Axis:   71 Text Interpretation:  Normal sinus rhythm Nonspecific T wave abnormality Abnormal ECG Confirmed by Marianna Fussykstra, Richard ([id number]) on 02/21/2019 6:27:35 PM   Radiology Dg Chest 2 View  Result Date: 02/21/2019 CLINICAL DATA:  Chest pressure and dizziness.  Shortness of breath EXAM: CHEST - 2 VIEW COMPARISON:  April 28, 2009 FINDINGS: Lungs are clear. Heart size and pulmonary vascularity are normal. No adenopathy. No pneumothorax. No bone lesions. IMPRESSION: No edema or consolidation. Electronically Signed   By: Bretta BangWilliam  Woodruff III M.D.   On: 02/21/2019 13:53    Procedures Procedures (including critical care time)  Medications Ordered in ED Medications  sodium chloride flush (NS) 0.9 % injection 3 mL (has no administration in time range)     Initial Impression / Assessment and Plan / ED Course  I have reviewed the triage vital signs and the nursing notes.  Pertinent labs & imaging results that were available during my care of the patient were reviewed by me and considered in my medical decision making (see chart for details).     Final Clinical Impressions(s) / ED Diagnoses   Final diagnoses:  Palpitations  Anxiety   53 year old male presenting for evaluation of palpitations ongoing for the last 2 weeks following a viral GI illness.  Is also had associated anxiety.  Is complaining of some chest pressure but denies chest pain.  No cough, fever, pleuritic pain or significant shortness of breath.  On exam, patient well-appearing.   Vital signs reassuring.  Exam is benign.  CBC nonacute BMP with slightly elevated creatinine of unknown chronicity.  Last labs were 10 years ago.   UA without evidence of urinary tract infection.   TSH is normal.   Initial troponin is normal.  Delta troponin is negative  EKG with NSR, Nonspecific T wave abnormality    Chest x-ray without edema or consolidation.  Patient's work-up is reassuring.  He is PERC negative.  PE seems less likely.  ACS seems unlikely at this time as patient's only risk factor is hypertension and his symptoms do not seem typical of ACS.  He also does complain of an anxiety component which may be contributing to his palpitations.  I do feel that it would be beneficial for him to have Holter monitor and further work-up with cardiology and/or pcp. referral to cardiology given. Advised on return precautions. He voices understanding and is in agreement with plan. All questions answered.    ED Discharge Orders    None       Rayne DuCouture, Kimberlyn Quiocho S, PA-C 02/21/19 2035    Milagros Lollykstra, Richard S, MD 02/22/19 [phone]

## 2019-02-21 NOTE — ED Triage Notes (Signed)
Pt with complaints of dizziness upon waking up this morning. He reports his heart rate fluctuating between 50-180's. Recently prescribed bp meds and antianxiety meds and is compliant. Recently seen at urgent care 3 times for complaints. Denies LOC, Chx pressure or SOB.

## 2019-02-21 NOTE — ED Notes (Signed)
Patient transported to X-ray 

## 2019-03-02 ENCOUNTER — Other Ambulatory Visit: Payer: Self-pay | Admitting: Family Medicine

## 2019-03-02 MED ORDER — HYDROXYZINE HCL 25 MG PO TABS: 25.0000 mg | ORAL_TABLET | Freq: Three times a day (TID) | ORAL | 0 refills | Status: DC | PRN

## 2019-03-03 ENCOUNTER — Telehealth: Payer: Self-pay | Admitting: Family Medicine

## 2019-03-03 NOTE — Telephone Encounter (Signed)
Called pt to set up appt per Dr. Redmond School but pt has been dismissed, I called pt and he is out of work but he will see about paying the balance and then set up appt to be seen.  Per Dr. Redmond School, ok to schedule pt after balance is paid

## 2019-03-12 ENCOUNTER — Other Ambulatory Visit: Payer: Self-pay

## 2019-03-12 ENCOUNTER — Ambulatory Visit (INDEPENDENT_AMBULATORY_CARE_PROVIDER_SITE_OTHER): Payer: Managed Care, Other (non HMO) | Admitting: Family Medicine

## 2019-03-12 ENCOUNTER — Encounter: Payer: Self-pay | Admitting: Family Medicine

## 2019-03-12 VITALS — BP 128/82 | HR 63 | Temp 98.0°F | Ht 70.5 in | Wt 225.2 lb

## 2019-03-12 DIAGNOSIS — Z630 Problems in relationship with spouse or partner: Secondary | ICD-10-CM

## 2019-03-12 DIAGNOSIS — Z1211 Encounter for screening for malignant neoplasm of colon: Secondary | ICD-10-CM

## 2019-03-12 DIAGNOSIS — Z23 Encounter for immunization: Secondary | ICD-10-CM | POA: Diagnosis not present

## 2019-03-12 DIAGNOSIS — M71349 Other bursal cyst, unspecified hand: Secondary | ICD-10-CM

## 2019-03-12 DIAGNOSIS — Z1159 Encounter for screening for other viral diseases: Secondary | ICD-10-CM

## 2019-03-12 DIAGNOSIS — R002 Palpitations: Secondary | ICD-10-CM

## 2019-03-12 DIAGNOSIS — Z125 Encounter for screening for malignant neoplasm of prostate: Secondary | ICD-10-CM | POA: Diagnosis not present

## 2019-03-12 NOTE — Progress Notes (Signed)
   Subjective:    Patient ID: Curtis Wallace., male    DOB: [birth year], 53 y.o.   MRN: [id number]  HPI He is here for consult concerning multiple issues.  He has been to the emergency room several times for evaluation of palpitations.  He has had blood work as well as EKG and was given hydroxyzine which did help.  He states that he has had no previous difficulties with this but is now dealing with a new job and also having difficulty with his marriage.  He has attempted to get involved in counseling however his wife has been reluctant to do this.  Now apparently they are just not talking and trying to minimize interaction.  No chest pain, shortness of breath, diaphoresis however he does note that sometimes when he thinks about something it will make his heart speed up.  He is also had occasions where he has not thought of anything and he did note elevated heart rate.  His watch those record this. He also has a cystic lesion present on his right hand that he would like further evaluated. He also has several other concerns including colonoscopy and further testing including PSA.  His last colonoscopy was over 10 years ago and was negative.  He has no family history of colon cancer or prostate cancer. Review of Systems     Objective:   Physical Exam Alert and in no distress.  Cardiac exam shows regular rhythm without murmurs or gallops.  Lungs are clear to auscultation.  Emergency room blood work and EKG was reviewed.  Blood work including TSH was normal Exam of the right hand does show a 2 cm round smooth movable lesion over the third MCP joint.      Assessment & Plan:  Palpitations - Plan: Cardiac event monitor  Encounter for hepatitis C screening test for low risk patient - Plan: Hepatitis C antibody  Screening for colon cancer - Plan: Cologuard  Screening for prostate cancer - Plan: PSA  Marital stress  Need for Tdap vaccination - Plan: Tdap vaccine greater than or equal to 7yo IM   Synovial cyst of hand I explained that I did not think the palpitations were of any major concern in terms of wrist to his health.  Explained that he has been to the hospital 4 times indicating he is not an immediate danger.  He was more comfortable with that.  I will do follow-up screening concerning a Holter monitor to fully evaluate the palpitations. Discussed the marital stress with him and encouraged him to get involved in counseling for himself and of necessity it will help him deal with his wife.  Discussed the fact that saving the marriage is not as important as saving the people in the marriage.  I think he appreciated that.  Follow-up after event monitor is obtained.

## 2019-03-13 LAB — PSA: Prostate Specific Ag, Serum: 0.4 ng/mL (ref 0.0–4.0)

## 2019-03-13 LAB — HEPATITIS C ANTIBODY: Hep C Virus Ab: <0.1 s/co ratio (ref 0.0–0.9)

## 2019-03-15 ENCOUNTER — Other Ambulatory Visit: Payer: Self-pay | Admitting: Family Medicine

## 2019-03-16 ENCOUNTER — Other Ambulatory Visit: Payer: Self-pay | Admitting: Family Medicine

## 2019-03-16 MED ORDER — HYDROXYZINE HCL 25 MG PO TABS: 25.0000 mg | ORAL_TABLET | Freq: Three times a day (TID) | ORAL | 0 refills | Status: DC | PRN

## 2019-03-16 MED ORDER — METOPROLOL TARTRATE 25 MG PO TABS: 25.0000 mg | ORAL_TABLET | Freq: Two times a day (BID) | ORAL | 3 refills | Status: DC

## 2019-03-16 MED ORDER — AMLODIPINE BESYLATE 5 MG PO TABS: 5.0000 mg | ORAL_TABLET | Freq: Every day | ORAL | 3 refills | Status: DC

## 2019-03-17 NOTE — Telephone Encounter (Signed)
Please advise dut to just was filled two weeks ago. [place] [facility]

## 2019-03-25 ENCOUNTER — Telehealth: Payer: Self-pay | Admitting: *Deleted

## 2019-03-25 LAB — COLOGUARD: Cologuard: NEGATIVE

## 2019-03-25 NOTE — Telephone Encounter (Signed)
Please call [phone] to set up Cardiac event monitor requested by Dr. Redmond School.

## 2019-03-26 ENCOUNTER — Encounter: Payer: Self-pay | Admitting: *Deleted

## 2019-03-26 ENCOUNTER — Ambulatory Visit (INDEPENDENT_AMBULATORY_CARE_PROVIDER_SITE_OTHER): Payer: Managed Care, Other (non HMO) | Admitting: Cardiology

## 2019-03-26 ENCOUNTER — Ambulatory Visit (INDEPENDENT_AMBULATORY_CARE_PROVIDER_SITE_OTHER): Payer: Managed Care, Other (non HMO)

## 2019-03-26 ENCOUNTER — Encounter: Payer: Self-pay | Admitting: Cardiology

## 2019-03-26 ENCOUNTER — Other Ambulatory Visit: Payer: Self-pay

## 2019-03-26 ENCOUNTER — Telehealth: Payer: Self-pay | Admitting: Cardiology

## 2019-03-26 VITALS — BP 130/78 | HR 76 | Ht 70.5 in | Wt 234.0 lb

## 2019-03-26 DIAGNOSIS — R079 Chest pain, unspecified: Secondary | ICD-10-CM

## 2019-03-26 DIAGNOSIS — R0789 Other chest pain: Secondary | ICD-10-CM

## 2019-03-26 DIAGNOSIS — R002 Palpitations: Secondary | ICD-10-CM

## 2019-03-26 DIAGNOSIS — I10 Essential (primary) hypertension: Secondary | ICD-10-CM | POA: Diagnosis not present

## 2019-03-26 DIAGNOSIS — R9431 Abnormal electrocardiogram [ECG] [EKG]: Secondary | ICD-10-CM

## 2019-03-26 NOTE — Telephone Encounter (Signed)
  Patient returning call regarding monitor, he will be in office this afternoon to see Dr Radford Pax

## 2019-03-26 NOTE — Progress Notes (Signed)
Cardiology Office Note    Date:  03/26/2019   ID:  Curtis Wallace., DOB June 16, 1966, MRN [id number]  PCP:  Curtis Nian, MD  Cardiologist:  Armanda Magic, MD (NEW)  Chief Complaint  Patient presents with  . New Patient (Initial Visit)    Palpitations    History of Present Illness:  Curtis Wallace. is a 53 y.o. male who is being seen today for the evaluation of Palpitations and chest pain at the request of Curtis Nian, MD.  This is a 53yo AAM with a hx of HTN who is referred by his PCP for evaluation of new onset palpitations and chest pain.  He tells me that recently he had food poisoning and developed severe N/V.  After that he developed severe chest discomfort but then resolved.  Since then he has had severe anxiety and is now on anxiolytics.  He also has developed an awareness of his heart beating and has had intermittent palpitations with HRs as high as 180's at times.  This occurs very sporadically with no rhythm or reason.  He has cut out all caffeine and ETOH.  He is having some problems with his stomach and says that he will get some discomfort in his upper abdomen and when he moves around he feels gurgling and then the discomfort resolves.  He is very active athlete and plays a lot of tennis and never has any chest discomfort or SOB.  He denies any PND, orthopnea, LE edema, dizziness or syncope.    Past Medical History:  Diagnosis Date  . Hypertension     Past Surgical History:  Procedure Laterality Date  . CIRCUMCISION      Current Medications: Current Meds  Medication Sig  . amLODipine (NORVASC) 5 MG tablet TAKE 1 TABLET BY MOUTH EVERY DAY  . hydrOXYzine (ATARAX/VISTARIL) 25 MG tablet Take 1 tablet (25 mg total) by mouth every 8 (eight) hours as needed.  . metoprolol tartrate (LOPRESSOR) 25 MG tablet TAKE 1 TABLET BY MOUTH TWICE A DAY  . omeprazole (PRILOSEC) 40 MG capsule Take 40 mg by mouth daily.  . sildenafil (VIAGRA) 100 MG tablet Take 0.5-1 tablets  (50-100 mg total) by mouth daily as needed for erectile dysfunction.  . Simethicone (MAALOX ANTI-GAS PO) Take 20 mLs by mouth at bedtime.  . [DISCONTINUED] amLODipine (NORVASC) 5 MG tablet Take 1 tablet (5 mg total) by mouth daily.  . [DISCONTINUED] metoprolol tartrate (LOPRESSOR) 25 MG tablet Take 1 tablet (25 mg total) by mouth 2 (two) times daily.    Allergies:   Patient has no known allergies.   Social History   Socioeconomic History  . Marital status: Married    Spouse name: Not on file  . Number of children: Not on file  . Years of education: Not on file  . Highest education level: Not on file  Occupational History  . Not on file  Social Needs  . Financial resource strain: Not on file  . Food insecurity    Worry: Not on file    Inability: Not on file  . Transportation needs    Medical: Not on file    Non-medical: Not on file  Tobacco Use  . Smoking status: Never Smoker  . Smokeless tobacco: Never Used  Substance and Sexual Activity  . Alcohol use: Yes  . Drug use: Not Currently  . Sexual activity: Not on file  Lifestyle  . Physical activity    Days per week: Not  on file    Minutes per session: Not on file  . Stress: Not on file  Relationships  . Social Musician on phone: Not on file    Gets together: Not on file    Attends religious service: Not on file    Active member of club or organization: Not on file    Attends meetings of clubs or organizations: Not on file    Relationship status: Not on file  Other Topics Concern  . Not on file  Social History Narrative  . Not on file     Family History:  The patient's Family history is unknown by patient.   ROS:   Please see the history of present illness.    ROS All other systems reviewed and are negative.  No flowsheet data found.     PHYSICAL EXAM:   VS:  BP 130/78   Pulse 76   Ht 5' 10.5" (1.791 m)   Wt 234 lb (106.1 kg)   SpO2 98%   BMI 33.10 kg/m    GEN: Well nourished, well  developed, in no acute distress  HEENT: normal  Neck: no JVD, carotid bruits, or masses Cardiac: RRR; no murmurs, rubs, or gallops,no edema.  Intact distal pulses bilaterally.  Respiratory:  clear to auscultation bilaterally, normal work of breathing GI: soft, nontender, nondistended, + BS MS: no deformity or atrophy  Skin: warm and dry, no rash Neuro:  Alert and Oriented x 3, Strength and sensation are intact Psych: euthymic mood, full affect  Wt Readings from Last 3 Encounters:  03/26/19 234 lb (106.1 kg)  03/12/19 225 lb 3.2 oz (102.2 kg)  02/21/19 227 lb (103 kg)      Studies/Labs Reviewed:   EKG:  EKG is ordered today.  The ekg ordered today demonstrates NSR with inferolateral T wave abnormality  Recent Labs: 02/21/2019: BUN 11; Creatinine, Ser 1.51; Hemoglobin 16.1; Platelets 289; Potassium 4.3; Sodium 138; TSH 1.408   Lipid Panel No results found for: CHOL, TRIG, HDL, CHOLHDL, VLDL, LDLCALC, LDLDIRECT  Additional studies/ records that were reviewed today include:  Office notes from PCP    ASSESSMENT:    1. Chest pain of uncertain etiology   2. Palpitations   3. Essential hypertension   4. Nonspecific abnormal electrocardiogram (ECG) (EKG)      PLAN:  In order of problems listed above:  1. Chest pain -his sx are really upper abdominal and resolve when he moves around and feels gurgling in his intestines -he is an avid tennis player and has not chest discomfort or DOE when playing tennis or any exertional activity.  I do not think his sx are cardiac and likely related to GI etiology. -EKG is abnormal with inferolateral T wave changes.   -recommend ETT to rule out ischemia and 2D echo to assess LVF  2.  Palpitations -these started after he had a GI illness -he describes two types: one that occurs with a hard beat when he lays on his left side and the other is a rapid heart beat that occurs up to 180bpm and lasts for a while. -I will get an event monitor to  assess for arrhythmias.   3.  HTN -BP controlled -continue amlodipine 5mg  daily, lopressor 25mg  BID    Medication Adjustments/Labs and Tests Ordered: Current medicines are reviewed at length with the patient today.  Concerns regarding medicines are outlined above.  Medication changes, Labs and Tests ordered today are listed in the Patient Instructions  below.  Patient Instructions  Medication Instructions:  Your physician recommends that you continue on your current medications as directed. Please refer to the Current Medication list given to you today.  If you need a refill on your cardiac medications before your next appointment, please call your pharmacy.   Lab work: None ordered  If you have labs (blood work) drawn today and your tests are completely normal, you will receive your results only by: Marland Kitchen MyChart Message (if you have MyChart) OR . A paper copy in the mail If you have any lab test that is abnormal or we need to change your treatment, we will call you to review the results.  Testing/Procedures: Your physician has requested that you have an echocardiogram. Echocardiography is a painless test that uses sound waves to create images of your heart. It provides your doctor with information about the size and shape of your heart and how well your heart's chambers and valves are working. This procedure takes approximately one hour. There are no restrictions for this procedure.   Your physician has requested that you have an exercise tolerance test. For further information please visit [email or web address] Please also follow instruction sheet, as given.    Follow-Up: At [facility], you and your health needs are our priority.  As part of our continuing mission to provide you with exceptional heart care, we have created designated Provider Care Teams.  These Care Teams include your primary Cardiologist (physician) and Advanced Practice Providers (APPs -  Physician Assistants and  Nurse Practitioners) who all work together to provide you with the care you need, when you need it. You will need a follow up appointment in AS NEEDED  Any Other Special Instructions Will Be Listed Below (If Applicable).   Exercise Stress Test An exercise stress test is a test to check how your heart works during exercise. You will need to walk on a treadmill or ride an exercise bike for this test. An electrocardiogram (ECG) will record your heartbeat when you are at rest and when you are exercising. You may have an ultrasound or nuclear test after the exercise test. The test is done to check for coronary artery disease (CAD). It is also done to:  See how well you can exercise.  Watch for high blood pressure during exercise.  Test how well you can exercise after treatment.  Check the blood flow to your arms and legs. If your test result is not normal, more testing may be needed. What happens before the procedure?  Follow instructions from your doctor about what you cannot eat or drink. ? Do not have any drinks or foods that have caffeine in them for 24 hours before the test, or as told by your doctor. This includes coffee, tea (even decaf tea), sodas, chocolate, and cocoa.  Ask your doctor about changing or stopping your normal medicines. This is important if you: ? Take diabetes medicines. ? Take beta-blocker medicines. ? Wear a nitroglycerin patch.  If you use an inhaler, bring it with you to the test.  Do not put lotions, powders, creams, or oils on your chest before the test.  Wear comfortable shoes and clothing.  Do not use any products that have nicotine or tobacco in them, such as cigarettes and e-cigarettes. Stop using them at least 4 hours before the test. If you need help quitting, ask your doctor. What happens during the procedure?   Patches (electrodes) will be put on your chest.  Wires will be connected  to the patches. The wires will send signals to a machine to  record your heartbeat.  Your heart rate will be watched while you are resting and while you are exercising. Your blood pressure will also be watched during the test.  You will walk on a treadmill or use a stationary bike. If you cannot use these, you may be asked to turn a crank with your hands.  The activity will get harder and will raise your heart rate.  You may be asked to breathe into a tube a few times during the test. This measures the gases that you breathe out.  You will be asked how you are feeling throughout the test.  You will exercise until your heart reaches a target heart rate. You will stop early if: ? You feel dizzy. ? You have chest pain. ? You are out of breath. ? Your blood pressure is too high or too low. ? You have an irregular heartbeat. ? You have pain or aching in your arms or legs. The procedure may vary among doctors and hospitals. What happens after the procedure?  Your blood pressure, heart rate, breathing rate, and blood oxygen level will be watched after the test.  You may return to your normal diet and activities as told by your doctor.  It is up to you to get the results of your test. Ask your doctor, or the department that is doing the test, when your results will be ready. Summary  An exercise stress test is a test to check how your heart works during exercise.  This test is done to check for coronary artery disease.  Your heart rate will be watched while you are resting and while you are exercising.  Follow instructions from your doctor about what you cannot eat or drink before the test. This information is not intended to replace advice given to you by your health care provider. Make sure you discuss any questions you have with your health care provider. Document Released: 12/06/2007 Document Revised: 10/01/2018 Document Reviewed: 09/19/2016 Elsevier Patient Education  2020 [employer]  Echocardiogram An echocardiogram is a procedure  that uses painless sound waves (ultrasound) to produce an image of the heart. Images from an echocardiogram can provide important information about:  Signs of coronary artery disease (CAD).  Aneurysm detection. An aneurysm is a weak or damaged part of an artery wall that bulges out from the normal force of blood pumping through the body.  Heart size and shape. Changes in the size or shape of the heart can be associated with certain conditions, including heart failure, aneurysm, and CAD.  Heart muscle function.  Heart valve function.  Signs of a past heart attack.  Fluid buildup around the heart.  Thickening of the heart muscle.  A tumor or infectious growth around the heart valves. Tell a health care provider about:  Any allergies you have.  All medicines you are taking, including vitamins, herbs, eye drops, creams, and over-the-counter medicines.  Any blood disorders you have.  Any surgeries you have had.  Any medical conditions you have.  Whether you are pregnant or may be pregnant. What are the risks? Generally, this is a safe procedure. However, problems may occur, including:  Allergic reaction to dye (contrast) that may be used during the procedure. What happens before the procedure? No specific preparation is needed. You may eat and drink normally. What happens during the procedure?   An IV tube may be inserted into one of your  veins.  You may receive contrast through this tube. A contrast is an injection that improves the quality of the pictures from your heart.  A gel will be applied to your chest.  A wand-like tool (transducer) will be moved over your chest. The gel will help to transmit the sound waves from the transducer.  The sound waves will harmlessly bounce off of your heart to allow the heart images to be captured in real-time motion. The images will be recorded on a computer. The procedure may vary among health care providers and hospitals. What  happens after the procedure?  You may return to your normal, everyday life, including diet, activities, and medicines, unless your health care provider tells you not to do that. Summary  An echocardiogram is a procedure that uses painless sound waves (ultrasound) to produce an image of the heart.  Images from an echocardiogram can provide important information about the size and shape of your heart, heart muscle function, heart valve function, and fluid buildup around your heart.  You do not need to do anything to prepare before this procedure. You may eat and drink normally.  After the echocardiogram is completed, you may return to your normal, everyday life, unless your health care provider tells you not to do that. This information is not intended to replace advice given to you by your health care provider. Make sure you discuss any questions you have with your health care provider. Document Released: 06/16/2000 Document Revised: 10/10/2018 Document Reviewed: 07/22/2016 Elsevier Patient Education  2020 , Fransico Him, MD  03/26/2019 7:06 PM    [address], [place], [place]  [address] Phone: [phone]; Fax: [phone]

## 2019-03-26 NOTE — Patient Instructions (Signed)
Medication Instructions:  Your physician recommends that you continue on your current medications as directed. Please refer to the Current Medication list given to you today.  If you need a refill on your cardiac medications before your next appointment, please call your pharmacy.   Lab work: None ordered  If you have labs (blood work) drawn today and your tests are completely normal, you will receive your results only by: Marland Kitchen MyChart Message (if you have MyChart) OR . A paper copy in the mail If you have any lab test that is abnormal or we need to change your treatment, we will call you to review the results.  Testing/Procedures: Your physician has requested that you have an echocardiogram. Echocardiography is a painless test that uses sound waves to create images of your heart. It provides your doctor with information about the size and shape of your heart and how well your heart's chambers and valves are working. This procedure takes approximately one hour. There are no restrictions for this procedure.   Your physician has requested that you have an exercise tolerance test. For further information please visit [email or web address] Please also follow instruction sheet, as given.    Follow-Up: At [facility], you and your health needs are our priority.  As part of our continuing mission to provide you with exceptional heart care, we have created designated Provider Care Teams.  These Care Teams include your primary Cardiologist (physician) and Advanced Practice Providers (APPs -  Physician Assistants and Nurse Practitioners) who all work together to provide you with the care you need, when you need it. You will need a follow up appointment in AS NEEDED  Any Other Special Instructions Will Be Listed Below (If Applicable).   Exercise Stress Test An exercise stress test is a test to check how your heart works during exercise. You will need to walk on a treadmill or ride an exercise bike  for this test. An electrocardiogram (ECG) will record your heartbeat when you are at rest and when you are exercising. You may have an ultrasound or nuclear test after the exercise test. The test is done to check for coronary artery disease (CAD). It is also done to:  See how well you can exercise.  Watch for high blood pressure during exercise.  Test how well you can exercise after treatment.  Check the blood flow to your arms and legs. If your test result is not normal, more testing may be needed. What happens before the procedure?  Follow instructions from your doctor about what you cannot eat or drink. ? Do not have any drinks or foods that have caffeine in them for 24 hours before the test, or as told by your doctor. This includes coffee, tea (even decaf tea), sodas, chocolate, and cocoa.  Ask your doctor about changing or stopping your normal medicines. This is important if you: ? Take diabetes medicines. ? Take beta-blocker medicines. ? Wear a nitroglycerin patch.  If you use an inhaler, bring it with you to the test.  Do not put lotions, powders, creams, or oils on your chest before the test.  Wear comfortable shoes and clothing.  Do not use any products that have nicotine or tobacco in them, such as cigarettes and e-cigarettes. Stop using them at least 4 hours before the test. If you need help quitting, ask your doctor. What happens during the procedure?   Patches (electrodes) will be put on your chest.  Wires will be connected to the patches. The wires  will send signals to a machine to record your heartbeat.  Your heart rate will be watched while you are resting and while you are exercising. Your blood pressure will also be watched during the test.  You will walk on a treadmill or use a stationary bike. If you cannot use these, you may be asked to turn a crank with your hands.  The activity will get harder and will raise your heart rate.  You may be asked to breathe  into a tube a few times during the test. This measures the gases that you breathe out.  You will be asked how you are feeling throughout the test.  You will exercise until your heart reaches a target heart rate. You will stop early if: ? You feel dizzy. ? You have chest pain. ? You are out of breath. ? Your blood pressure is too high or too low. ? You have an irregular heartbeat. ? You have pain or aching in your arms or legs. The procedure may vary among doctors and hospitals. What happens after the procedure?  Your blood pressure, heart rate, breathing rate, and blood oxygen level will be watched after the test.  You may return to your normal diet and activities as told by your doctor.  It is up to you to get the results of your test. Ask your doctor, or the department that is doing the test, when your results will be ready. Summary  An exercise stress test is a test to check how your heart works during exercise.  This test is done to check for coronary artery disease.  Your heart rate will be watched while you are resting and while you are exercising.  Follow instructions from your doctor about what you cannot eat or drink before the test. This information is not intended to replace advice given to you by your health care provider. Make sure you discuss any questions you have with your health care provider. Document Released: 12/06/2007 Document Revised: 10/01/2018 Document Reviewed: 09/19/2016 Elsevier Patient Education  [place].  Echocardiogram An echocardiogram is a procedure that uses painless sound waves (ultrasound) to produce an image of the heart. Images from an echocardiogram can provide important information about:  Signs of coronary artery disease (CAD).  Aneurysm detection. An aneurysm is a weak or damaged part of an artery wall that bulges out from the normal force of blood pumping through the body.  Heart size and shape. Changes in the size or shape  of the heart can be associated with certain conditions, including heart failure, aneurysm, and CAD.  Heart muscle function.  Heart valve function.  Signs of a past heart attack.  Fluid buildup around the heart.  Thickening of the heart muscle.  A tumor or infectious growth around the heart valves. Tell a health care provider about:  Any allergies you have.  All medicines you are taking, including vitamins, herbs, eye drops, creams, and over-the-counter medicines.  Any blood disorders you have.  Any surgeries you have had.  Any medical conditions you have.  Whether you are pregnant or may be pregnant. What are the risks? Generally, this is a safe procedure. However, problems may occur, including:  Allergic reaction to dye (contrast) that may be used during the procedure. What happens before the procedure? No specific preparation is needed. You may eat and drink normally. What happens during the procedure?   An IV tube may be inserted into one of your veins.  You may receive  contrast through this tube. A contrast is an injection that improves the quality of the pictures from your heart.  A gel will be applied to your chest.  A wand-like tool (transducer) will be moved over your chest. The gel will help to transmit the sound waves from the transducer.  The sound waves will harmlessly bounce off of your heart to allow the heart images to be captured in real-time motion. The images will be recorded on a computer. The procedure may vary among health care providers and hospitals. What happens after the procedure?  You may return to your normal, everyday life, including diet, activities, and medicines, unless your health care provider tells you not to do that. Summary  An echocardiogram is a procedure that uses painless sound waves (ultrasound) to produce an image of the heart.  Images from an echocardiogram can provide important information about the size and shape of  your heart, heart muscle function, heart valve function, and fluid buildup around your heart.  You do not need to do anything to prepare before this procedure. You may eat and drink normally.  After the echocardiogram is completed, you may return to your normal, everyday life, unless your health care provider tells you not to do that. This information is not intended to replace advice given to you by your health care provider. Make sure you discuss any questions you have with your health care provider. Document Released: 06/16/2000 Document Revised: 10/10/2018 Document Reviewed: 07/22/2016 Elsevier Patient Education  2020 [employer].

## 2019-03-31 ENCOUNTER — Other Ambulatory Visit: Payer: Self-pay | Admitting: Family Medicine

## 2019-03-31 NOTE — Telephone Encounter (Signed)
[facility] is requesting to fill [facility]. Please advise [facility]

## 2019-03-31 NOTE — Telephone Encounter (Signed)
See how he is doing in regard to the Atarax.

## 2019-03-31 NOTE — Telephone Encounter (Signed)
LVM for pt to call office to advise [facility]

## 2019-04-01 ENCOUNTER — Telehealth: Payer: Self-pay

## 2019-04-01 ENCOUNTER — Ambulatory Visit (HOSPITAL_COMMUNITY): Payer: Managed Care, Other (non HMO) | Attending: Cardiology

## 2019-04-01 ENCOUNTER — Other Ambulatory Visit: Payer: Self-pay

## 2019-04-01 DIAGNOSIS — R079 Chest pain, unspecified: Secondary | ICD-10-CM

## 2019-04-01 DIAGNOSIS — R0789 Other chest pain: Secondary | ICD-10-CM | POA: Diagnosis not present

## 2019-04-01 DIAGNOSIS — R9431 Abnormal electrocardiogram [ECG] [EKG]: Secondary | ICD-10-CM | POA: Diagnosis not present

## 2019-04-01 MED ORDER — HYDROXYZINE HCL 25 MG PO TABS: 25.0000 mg | ORAL_TABLET | Freq: Three times a day (TID) | ORAL | 0 refills | Status: DC | PRN

## 2019-04-01 NOTE — Addendum Note (Signed)
Addended by: Denita Lung on: 04/01/2019 01:41 PM   Modules accepted: Orders

## 2019-04-01 NOTE — Progress Notes (Signed)
Pt was advised Curtis Wallace 

## 2019-04-01 NOTE — Telephone Encounter (Signed)
Pt wanted to advise med is working well for him please advise if it can be filled again per pt request. [facility]

## 2019-04-03 ENCOUNTER — Encounter (HOSPITAL_COMMUNITY): Payer: Self-pay | Admitting: Cardiology

## 2019-04-08 ENCOUNTER — Telehealth (HOSPITAL_COMMUNITY): Payer: Self-pay

## 2019-04-08 NOTE — Telephone Encounter (Signed)
New message    Just an FYI. We have made several attempts to contact this patient including sending a letter to schedule or reschedule their Exercise Tolerance Test. We will be removing the patient from the  WQ.  10.1.20 mail reminder letter Shirlee Whitmire  9.29.20 @ 12:26pm lm on home vm [place] 9.24.20 @ 8:33am both # are the same  - lm on home vm - Leone Mobley

## 2019-04-16 ENCOUNTER — Telehealth: Payer: Self-pay | Admitting: Cardiology

## 2019-04-16 NOTE — Telephone Encounter (Signed)
New message     Pt started wearing an event monitor on 03-26-19.  It has irritated his skin so bad that he cannot put it back on until it heals.  Calling to see if Dr Radford Pax has received enough info and he does not have to put it on or how long should he wait to put monitor back on. Please advise

## 2019-04-16 NOTE — Telephone Encounter (Signed)
[employer] contacted to ship monitor base and pediatric sensitive skin electrodes.  It will take a couple days to receive new supplies.  Please do not place electrodes on irritated skin.  Once you have your sensitive skin electrodes, you may apply monitor avoiding irritated areas.  If we could get a couple more days of data, that would be great.  Your end of service date is 04/25/19.

## 2019-05-03 ENCOUNTER — Other Ambulatory Visit (HOSPITAL_COMMUNITY)
Admission: RE | Admit: 2019-05-03 | Discharge: 2019-05-03 | Disposition: A | Discharge status: Home / Self Care | Payer: Managed Care, Other (non HMO) | Source: Ambulatory Visit | Attending: Cardiology | Admitting: Cardiology

## 2019-05-03 DIAGNOSIS — Z01812 Encounter for preprocedural laboratory examination: Secondary | ICD-10-CM | POA: Insufficient documentation

## 2019-05-03 DIAGNOSIS — Z20828 Contact with and (suspected) exposure to other viral communicable diseases: Secondary | ICD-10-CM | POA: Diagnosis not present

## 2019-05-04 LAB — NOVEL CORONAVIRUS, NAA (HOSP ORDER, SEND-OUT TO REF LAB; TAT 18-24 HRS): SARS-CoV-2, NAA: NOT DETECTED

## 2019-05-06 ENCOUNTER — Telehealth (HOSPITAL_COMMUNITY): Payer: Self-pay

## 2019-05-06 NOTE — Telephone Encounter (Signed)
Encounter complete. 

## 2019-05-07 ENCOUNTER — Ambulatory Visit (HOSPITAL_COMMUNITY)
Admission: RE | Admit: 2019-05-07 | Payer: Managed Care, Other (non HMO) | Source: Ambulatory Visit | Attending: Cardiology | Admitting: Cardiology

## 2019-05-08 ENCOUNTER — Other Ambulatory Visit: Payer: Self-pay

## 2019-05-08 ENCOUNTER — Ambulatory Visit (HOSPITAL_COMMUNITY)
Admission: RE | Admit: 2019-05-08 | Discharge: 2019-05-08 | Disposition: A | Discharge status: Home / Self Care | Payer: Managed Care, Other (non HMO) | Source: Ambulatory Visit | Attending: Cardiology | Admitting: Cardiology

## 2019-05-08 DIAGNOSIS — R079 Chest pain, unspecified: Secondary | ICD-10-CM

## 2019-05-08 DIAGNOSIS — R9431 Abnormal electrocardiogram [ECG] [EKG]: Secondary | ICD-10-CM | POA: Insufficient documentation

## 2019-05-09 LAB — EXERCISE TOLERANCE TEST
Estimated workload: 10.1 METS
Exercise duration (min): 8 min
Exercise duration (sec): 0 s
MPHR: 165 {beats}/min
Peak HR: 157 {beats}/min
Percent HR: 95 %
RPE: 17
Rest HR: 76 {beats}/min

## 2019-06-08 ENCOUNTER — Other Ambulatory Visit: Payer: Self-pay | Admitting: Family Medicine

## 2019-06-09 NOTE — Telephone Encounter (Signed)
[facility] to [facility] pt [facility]. Please advise [facility]

## 2019-07-02 ENCOUNTER — Ambulatory Visit (INDEPENDENT_AMBULATORY_CARE_PROVIDER_SITE_OTHER): Payer: Managed Care, Other (non HMO) | Admitting: Family Medicine

## 2019-07-02 ENCOUNTER — Encounter: Payer: Self-pay | Admitting: Family Medicine

## 2019-07-02 ENCOUNTER — Other Ambulatory Visit: Payer: Self-pay

## 2019-07-02 VITALS — BP 132/86 | HR 56 | Temp 98.2°F | Wt 243.0 lb

## 2019-07-02 DIAGNOSIS — R200 Anesthesia of skin: Secondary | ICD-10-CM

## 2019-07-02 DIAGNOSIS — G8929 Other chronic pain: Secondary | ICD-10-CM

## 2019-07-02 DIAGNOSIS — R002 Palpitations: Secondary | ICD-10-CM | POA: Diagnosis not present

## 2019-07-02 DIAGNOSIS — R5383 Other fatigue: Secondary | ICD-10-CM

## 2019-07-02 DIAGNOSIS — M79644 Pain in right finger(s): Secondary | ICD-10-CM

## 2019-07-02 DIAGNOSIS — M25562 Pain in left knee: Secondary | ICD-10-CM | POA: Diagnosis not present

## 2019-07-02 NOTE — Progress Notes (Signed)
   Subjective:    Patient ID: Curtis Pretty., male    DOB: [birth year], 53 y.o.   MRN: [id number]  HPI He is here for consult concerning multiple issues.  He notes difficulty with fatigue and thinks it is medication related.  He states that this started when he was placed on his blood pressure medications.  The fatigue seems to be more when he is sitting for periods of time.  No shortness of breath, difficulty breathing.  Does have a previous history of palpitations but has not had any of these recently. He also complains of pain with flexion of the right thumb and feels a clicking sensation in that.  He then described left knee pain that tends to bother him only when he plays tennis.  He describes it as posterior but no popping, locking or grinding.  He notes a numb feeling in his hands mainly at night and describes a sensation in the third fourth and fifth fingers.  He can shake his hands and this will go away.   Review of Systems     Objective:   Physical Exam Alert and in no distress.  Blood pressure is recorded.  Exam of the right thumb does show slight subluxing of the DIP joint.       Assessment & Plan:  Palpitations:   Discussed the palpitations and fatigue with him and will have him stop his metoprolol and keep track of his blood pressure.  He is to return here in 1 month for recheck.  Fatigue, unspecified type : As above.  Chronic pain of right thumb:   I explained that this does sound like slight subluxation of the thumb but is not causing him a great deal difficulty.  Recommend general exercise, and if continued difficulty, I will refer him to a hand surgeon.  He was comfortable with that.  Left knee pain, unspecified chronicity: I explained that this is probably a degenerative arthritic type of problem and recommend premedicating with pain med of choice.  If the symptoms get worse we can reevaluate and do more testing.  Explained that this does not sound like that this would  require any surgery.  He was comfortable with that  Hand numbness:: I explained that he is describing ulnar nerve issue that is probably occurring at the elbow.  Recommend when he has the symptoms, straighten his elbow and that this would probably go away.  He was comfortable with that.

## 2019-08-04 ENCOUNTER — Ambulatory Visit (INDEPENDENT_AMBULATORY_CARE_PROVIDER_SITE_OTHER): Payer: Managed Care, Other (non HMO) | Admitting: Family Medicine

## 2019-08-04 ENCOUNTER — Other Ambulatory Visit: Payer: Self-pay

## 2019-08-04 ENCOUNTER — Encounter: Payer: Self-pay | Admitting: Family Medicine

## 2019-08-04 VITALS — BP 120/74 | HR 78 | Temp 97.8°F | Wt 244.2 lb

## 2019-08-04 DIAGNOSIS — R002 Palpitations: Secondary | ICD-10-CM | POA: Diagnosis not present

## 2019-08-04 DIAGNOSIS — R200 Anesthesia of skin: Secondary | ICD-10-CM | POA: Diagnosis not present

## 2019-08-04 DIAGNOSIS — G8929 Other chronic pain: Secondary | ICD-10-CM

## 2019-08-04 DIAGNOSIS — M79644 Pain in right finger(s): Secondary | ICD-10-CM

## 2019-08-04 DIAGNOSIS — N529 Male erectile dysfunction, unspecified: Secondary | ICD-10-CM

## 2019-08-04 MED ORDER — SILDENAFIL CITRATE 20 MG PO TABS: ORAL_TABLET | ORAL | 2 refills | Status: DC

## 2019-08-04 NOTE — Progress Notes (Signed)
   Subjective:    Patient ID: Curtis Wallace., male    DOB: May 04, 1966, 53 y.o.   MRN: [id number]  HPI He is here for recheck.  He has stopped taking the metoprolol and has noted only occasional dizziness.  He continues on amlodipine.  He feels much better about this since he is not really having a lot of trouble.  He also has had difficulty with right thumb triggering sensation.  Is now starting to give him difficulty.  He would also like a refill on his ED medication. He also complains of numbness to the fourth and fifth fingers of his hands especially when he lies in a certain position, usually with his elbows bent.  Review of Systems     Objective:   Physical Exam Alert and in no distress.  Blood pressure is recorded.  Triggering sensation noted in the DIP joint of the right thumb.       Assessment & Plan:  Palpitations  Hand numbness  Chronic pain of right thumb - Plan: Ambulatory referral to Orthopedic Surgery  Erectile dysfunction, unspecified erectile dysfunction type - Plan: sildenafil (REVATIO) 20 MG tablet No further intervention needed concerning the palpitations as these are not causing current difficulty. I discussed the hand situation and explained that this is probably an ulnar nerve issue at the elbow and to keep his elbow more straight to help eliminate this.  He was comfortable with that. Refer to orthopedics concerning the trigger thumb. Discussed the use of sildenafil to help with his ED recommend use this on an as-needed basis.  Did discuss briefly possible side effects.

## 2019-08-14 ENCOUNTER — Encounter: Payer: Self-pay | Admitting: Physician Assistant

## 2019-08-14 ENCOUNTER — Other Ambulatory Visit: Payer: Self-pay

## 2019-08-14 ENCOUNTER — Ambulatory Visit (INDEPENDENT_AMBULATORY_CARE_PROVIDER_SITE_OTHER): Payer: Managed Care, Other (non HMO) | Admitting: Physician Assistant

## 2019-08-14 DIAGNOSIS — M65311 Trigger thumb, right thumb: Secondary | ICD-10-CM

## 2019-08-14 MED ORDER — LIDOCAINE HCL 1 % IJ SOLN
0.5000 mL | INTRAMUSCULAR | Status: AC | PRN
  Administered 2019-08-14: .5 mL

## 2019-08-14 MED ORDER — METHYLPREDNISOLONE ACETATE 40 MG/ML IJ SUSP
20.0000 mg | INTRAMUSCULAR | Status: AC | PRN
  Administered 2019-08-14: 20 mg

## 2019-08-14 NOTE — Progress Notes (Addendum)
Office Visit Note   Patient: Curtis Wallace.           Date of Birth: December 22, 1965           MRN: [id number] Visit Date: 08/14/2019              Requested by: Curtis Nian, MD [address] [place],  [place] [address] PCP: Curtis Nian, MD   Assessment & Plan: Visit Diagnoses:  1. Trigger thumb, right thumb     Plan: We will have him apply Voltaren gel over the left middle finger A1 pulley region.  He can also apply this to the right thumb A1 pulley region.  Recommend no more than 3 injections of right thumb no more often than every 3 months.  He did ask about his knee has some swelling about the knee after playing.  But no mechanical symptoms needed.  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  Follow-up as needed.  Questions encouraged and answered  Follow-Up Instructions: Return if symptoms worsen or fail to improve.   Orders:  Orders Placed This Encounter  Procedures  . Hand/UE Inj   No orders of the defined types were placed in this encounter.     Procedures: Hand/UE Inj: R thumb A1 for trigger finger on 08/14/2019 3:43 PM Medications: 0.5 mL lidocaine 1 %; 20 mg methylPREDNISolone acetate 40 MG/ML      Clinical Data: No additional findings.   Subjective: Chief Complaint  Patient presents with  . Right Thumb - Pain    HPI [name] is a pleasant 54 year old male comes in today with right that is locking.  Is been ongoing for the past 2 months no known injury.  He does play a lot of games on his phone and is wondering if this is the cause.  He also has some locking of his left middle finger but not to the degree that his right thumb locks.  Nondiabetic.  No known injury.  Review of Systems Negative for fevers, chills, shortness of breath or chest pain.  Objective: Vital Signs: There were no vitals taken for this visit.  Physical Exam General: Well-developed well-nourished male no  acute distress mood appropriate Psych: Alert and oriented x3 Ortho Exam Bilateral hands full sensation.  Full motor.  Active right trigger thumb.  Tenderness over the A1 pulley of the right thumb.  Remainder of the hand without triggering fingers.  Left hand he has tenderness over the A1 pulley of the long finger but no active triggering.  Specialty Comments:  No specialty comments available.  Imaging: No results found.   PMFS History: There are no problems to display for this patient.  Past Medical History:  Diagnosis Date  . Hypertension     Family History  Family history unknown: Yes    Past Surgical History:  Procedure Laterality Date  . CIRCUMCISION     Social History   Occupational History  . Not on file  Tobacco Use  . Smoking status: Never Smoker  . Smokeless tobacco: Never Used  Substance and Sexual Activity  . Alcohol use: Yes  . Drug use: Not Currently  . Sexual activity: Not on file

## 2019-09-05 ENCOUNTER — Telehealth: Payer: Self-pay | Admitting: Family Medicine

## 2019-09-05 NOTE — Telephone Encounter (Signed)
Yes definitely start back on the pills.

## 2019-09-05 NOTE — Telephone Encounter (Signed)
Pt called and said he had a tooth pulled on Tuesday and has been feeling light headed since then. He has been off his blood pressure medication since December and is wondering if he should start back taking it. He also has a BP machine at home and his numbers have been higher than normal.

## 2019-09-05 NOTE — Telephone Encounter (Signed)
Pt called back and was in formed

## 2019-09-11 ENCOUNTER — Other Ambulatory Visit: Payer: Self-pay | Admitting: Family Medicine

## 2019-09-11 NOTE — Telephone Encounter (Signed)
[facility] . Please advise [facility] 

## 2019-09-15 ENCOUNTER — Telehealth: Payer: Self-pay | Admitting: Cardiology

## 2019-09-15 NOTE — Telephone Encounter (Signed)
Called and spoke to patient. He states that for the last month he has had intermittent episodes of feeling dizzy and unbalanced. He states that this is not associated with position changes and does not occur at rest. He states that he has been staying well hydrated and avoids alcholol and caffeine. Patient states that he can feel his heartbeat when he lays down. He also states that his BP has been 134/85 and 139/86 HR 58-62. He states that his BP monitor said that his HR was irregular. Monitor from 03/2019 showed SB, NSR, and ST. Patient is in [place] until 3/22. He has a virtual appt on 3/24 with Jacolyn Reedy, PA. ER precautions reviewed with the patient. He verbalized understanding and thanked me for the call.

## 2019-09-15 NOTE — Telephone Encounter (Signed)
  STAT if patient feels like he/she is going to faint   1) Are you dizzy now? No, but happens every day  2) Do you feel faint or have you passed out? Has not fainted  3) Do you have any other symptoms? no  4) Have you checked your HR and BP (record if available)? 131/86 HR 62,  134/85 HR 58   Patient states he has been having a very light, light-headed feeling. He says it feels like he is going off balance, but his BP and HR has been normal. He also says he has noticed he can feel his heart beating, which he could not before. He says he also has a pressure on eyes, but it may be eye fatigue. He is currently in [place], but has a virtual appointment with Jacolyn Reedy 09/24/2019 at 12:45pm.

## 2019-09-23 NOTE — Progress Notes (Signed)
Virtual Visit via Video Note   This visit type was conducted due to national recommendations for restrictions regarding the COVID-19 Pandemic (e.g. social distancing) in an effort to limit this patient's exposure and mitigate transmission in our community.  Due to his co-morbid illnesses, this patient is at least at moderate risk for complications without adequate follow up.  This format is felt to be most appropriate for this patient at this time.  All issues noted in this document were discussed and addressed.  A limited physical exam was performed with this format.  Please refer to the patient's chart for his consent to telehealth for [facility].   The patient was identified using 2 identifiers.  Date:  09/24/2019   ID:  Curtis Helper., DOB [birth year], MRN [id number]  Patient Location: Home Provider Location: Home  PCP:  [name], MD  Cardiologist:  Armanda Magic, MD  Electrophysiologist:  None   Evaluation Performed:  Follow-Up Visit  Chief Complaint:  f/u  History of Present Illness:    Curtis Brainerd. is a 54 y.o. male with history of HTN who saw Dr. Mayford Knife 03/26/19 for chest pain and palpitations. Very active and no symptoms with exercise. Had cut out caffeine and ETOH. Chest pain was felt to be atypical and GXT 05/08/19 was normal.Echo with normal LVEF 60-65% with some DD. Monitor SB,NSR, ST HR 40-132 bpm.     Had to connect with phone as mychart video breaking up. BP had been running high 161/90, 170/101 sporadically for a couple of days. .He became a little dizzy. BP monitor indicated an irregular heart beat. He had canned green beans but nothing else. Sometimes eats [place] or [place] food.No alcohol or caffeine. Is under some stress that could be contributing. Went to [place] to visit his daughters and did better. Last night when he laid down he felt whoozy, room isn't spinning. Better when he sits up. Had vertigo last year after  followed by anxiety.Taking more  vistaril recently-at least one daily-helps him sleep.   The patient does not have symptoms concerning for COVID-19 infection (fever, chills, cough, or new shortness of breath).    Past Medical History:  Diagnosis Date  . Hypertension    Past Surgical History:  Procedure Laterality Date  . CIRCUMCISION       Current Meds  Medication Sig  . amLODipine (NORVASC) 5 MG tablet Take 1 tablet (5 mg total) by mouth daily. May take an extra tablet daily as needed for elevated blood pressure  . hydrOXYzine (ATARAX/VISTARIL) 25 MG tablet TAKE 1 TABLET BY MOUTH EVERY 8 HOURS AS NEEDED  . metoprolol tartrate (LOPRESSOR) 25 MG tablet TAKE 1 TABLET BY MOUTH TWICE A DAY  . sildenafil (REVATIO) 20 MG tablet Take 1 anywhere from 1-5  pills as needed up with as needed daily  . [DISCONTINUED] amLODipine (NORVASC) 5 MG tablet TAKE 1 TABLET BY MOUTH EVERY DAY     Allergies:   Patient has no known allergies.   Social History   Tobacco Use  . Smoking status: Never Smoker  . Smokeless tobacco: Never Used  Substance Use Topics  . Alcohol use: Yes  . Drug use: Not Currently     Family Hx: The patient's Family history is unknown by patient.  ROS:   Please see the history of present illness.      All other systems reviewed and are negative.   Prior CV studies:   The following studies were reviewed today:  Holter monitor 03/26/19  Sinus bradycardia, normal sinus rhythm and sinus tachycardia. The average heart rate was 78bpm and ranged from 40 to 132bpm.   2Decho 04/01/19 IMPRESSIONS     1. Left ventricular ejection fraction, by visual estimation, is 60 to  65%. The left ventricle has normal function. Normal left ventricular size.  There is no left ventricular hypertrophy.   2. Left ventricular diastolic Doppler parameters are consistent with  impaired relaxation pattern of LV diastolic filling.   3. Global right ventricle has normal systolic function.The right  ventricular size is  normal. No increase in right ventricular wall  thickness.   4. Left atrial size was normal.   5. Right atrial size was normal.   6. The mitral valve is normal in structure. No evidence of mitral valve  regurgitation. No evidence of mitral stenosis.   7. The tricuspid valve is normal in structure. Tricuspid valve  regurgitation is trivial.   8. The aortic valve is normal in structure. Aortic valve regurgitation  was not visualized by color flow Doppler. Structurally normal aortic  valve, with no evidence of sclerosis or stenosis.   9. The pulmonic valve was normal in structure. Pulmonic valve  regurgitation is not visualized by color flow Doppler.  10. Normal pulmonary artery systolic pressure.  11. The inferior vena cava is normal in size with greater than 50%  respiratory variability, suggesting right atrial pressure of 3 mmHg.   FINDINGS   Left Ventricle: Left ventricular ejection fraction, by visual estimation,  is 60 to 65%. The left ventricle has normal function. There is no left  ventricular hypertrophy. Normal left ventricular size. Spectral Doppler  shows Left ventricular diastolic  Doppler parameters are consistent with impaired relaxation pattern of LV  diastolic filling.   Right Ventricle: The right ventricular size is normal. No increase in  right ventricular wall thickness. Global RV systolic function is has  normal systolic function. The tricuspid regurgitant velocity is 2.46 m/s,  and with an assumed right atrial pressure   of 3 mmHg, the estimated right ventricular systolic pressure is normal at  27.2 mmHg.   Left Atrium: Left atrial size was normal in size.   Right Atrium: Right atrial size was normal in size   Pericardium: There is no evidence of pericardial effusion.   Mitral Valve: The mitral valve is normal in structure. No evidence of  mitral valve stenosis by observation. No evidence of mitral valve  regurgitation.   Tricuspid Valve: The tricuspid  valve is normal in structure. Tricuspid  valve regurgitation is trivial by color flow Doppler.   Aortic Valve: The aortic valve is normal in structure. Aortic valve  regurgitation was not visualized by color flow Doppler. The aortic valve  is structurally normal, with no evidence of sclerosis or stenosis.   Pulmonic Valve: The pulmonic valve was normal in structure. Pulmonic valve  regurgitation is not visualized by color flow Doppler.   Aorta: The aortic root, ascending aorta and aortic arch are all  structurally normal, with no evidence of dilitation or obstruction.   Venous: The inferior vena cava is normal in size with greater than 50%  respiratory variability, suggesting right atrial pressure of 3 mmHg.   IAS/Shunts: No atrial level shunt detected by color flow Doppler. No  ventricular septal defect is seen or detected. There is no evidence of an  atrial septal defect.   GXT 05/08/19  The patient walked for 8:00 of a Bruce protocol GXT. Peak HR of 157  which is 95% predicted max HR  The BP response was normal  There were no ST or T wave changes to suggest ischemia. No QRS widening at end exercise  This is interpreted as a negative GXT . There is no evidence of ischemia.        Labs/Other Tests and Data Reviewed:    EKG:  No ECG reviewed.  Recent Labs: 02/21/2019: BUN 11; Creatinine, Ser 1.51; Hemoglobin 16.1; Platelets 289; Potassium 4.3; Sodium 138; TSH 1.408   Recent Lipid Panel No results found for: CHOL, TRIG, HDL, CHOLHDL, LDLCALC, LDLDIRECT  Wt Readings from Last 3 Encounters:  09/23/19 230 lb 9.6 oz (104.6 kg)  08/04/19 244 lb 3.2 oz (110.8 kg)  07/02/19 243 lb (110.2 kg)     Objective:    Vital Signs:  BP 128/88   Pulse (!) 54   Ht 5' 11.5" (1.816 m)   Wt 230 lb 9.6 oz (104.6 kg)   BMI 31.71 kg/m    VITAL SIGNS:  reviewed GEN:  no acute distress RESPIRATORY:  normal respiratory effort, symmetric expansion CARDIOVASCULAR:  no peripheral  edema  ASSESSMENT & PLAN:    Dizziness when he lays down and better when he sits up-under more stress and taking vistaril to sleep. Similar to when he had vertigo last year but not as bad. I've asked him to decrease vistaril, will try some meclizine and f/u with PCP  History of Palpitations monitor 03/26/19-ok  History of chest pain- GXT normal 05/08/19  HTN-BP running high at times but then normal. Can take an extra amlodipine if running high. 2 gm sodium diet.  COVID-19 Education: The signs and symptoms of COVID-19 were discussed with the patient and how to seek care for testing (follow up with PCP or arrange E-visit).   The importance of social distancing was discussed today.  Time:   Today, I have spent 24:08 minutes with the patient with telehealth technology discussing the above problems.     Medication Adjustments/Labs and Tests Ordered: Current medicines are reviewed at length with the patient today.  Concerns regarding medicines are outlined above.   Tests Ordered: No orders of the defined types were placed in this encounter.   Medication Changes: Meds ordered this encounter  Medications  . DISCONTD: meclizine (ANTIVERT) 25 MG tablet    Sig: Take 1 tablet (25 mg total) by mouth 3 (three) times daily as needed for dizziness.    Dispense:  30 tablet    Refill:  0  . amLODipine (NORVASC) 5 MG tablet    Sig: Take 1 tablet (5 mg total) by mouth daily. May take an extra tablet daily as needed for elevated blood pressure    Dispense:  90 tablet    Refill:  3  . meclizine (ANTIVERT) 25 MG tablet    Sig: Take 0.5 tablets (12.5 mg total) by mouth 3 (three) times daily as needed for dizziness.    Dispense:  30 tablet    Refill:  0    Follow Up:  Either In Person or Virtual in 4 week(s) Dr. Radford Pax or Ermalinda Barrios PA-C  Signed, Ermalinda Barrios, PA-C  09/24/2019 1:30 PM    [facility]

## 2019-09-24 ENCOUNTER — Encounter: Payer: Self-pay | Admitting: Physician Assistant

## 2019-09-24 ENCOUNTER — Telehealth (INDEPENDENT_AMBULATORY_CARE_PROVIDER_SITE_OTHER): Payer: Managed Care, Other (non HMO) | Admitting: Physician Assistant

## 2019-09-24 ENCOUNTER — Telehealth: Payer: Self-pay

## 2019-09-24 VITALS — BP 128/88 | HR 54 | Ht 71.5 in | Wt 230.6 lb

## 2019-09-24 DIAGNOSIS — R42 Dizziness and giddiness: Secondary | ICD-10-CM

## 2019-09-24 DIAGNOSIS — Z87898 Personal history of other specified conditions: Secondary | ICD-10-CM | POA: Diagnosis not present

## 2019-09-24 DIAGNOSIS — R002 Palpitations: Secondary | ICD-10-CM | POA: Diagnosis not present

## 2019-09-24 DIAGNOSIS — I10 Essential (primary) hypertension: Secondary | ICD-10-CM | POA: Diagnosis not present

## 2019-09-24 MED ORDER — MECLIZINE HCL 25 MG PO TABS: 25.0000 mg | ORAL_TABLET | Freq: Three times a day (TID) | ORAL | 0 refills | Status: DC | PRN

## 2019-09-24 MED ORDER — AMLODIPINE BESYLATE 5 MG PO TABS: 5.0000 mg | ORAL_TABLET | Freq: Every day | ORAL | 3 refills | Status: DC

## 2019-09-24 MED ORDER — MECLIZINE HCL 25 MG PO TABS: 12.5000 mg | ORAL_TABLET | Freq: Three times a day (TID) | ORAL | 0 refills | Status: DC | PRN

## 2019-09-24 NOTE — Telephone Encounter (Signed)
YOUR CARDIOLOGY TEAM HAS ARRANGED FOR AN E-VISIT FOR YOUR APPOINTMENT - PLEASE REVIEW IMPORTANT INFORMATION BELOW SEVERAL DAYS PRIOR TO YOUR APPOINTMENT  Due to the recent COVID-19 pandemic, we are transitioning in-person office visits to tele-medicine visits in an effort to decrease unnecessary exposure to our patients, their families, and staff. These visits are billed to your insurance just like a normal visit is. We also encourage you to sign up for MyChart if you have not already done so. You will need a smartphone if possible. For patients that do not have this, we can still complete the visit using a regular telephone but do prefer a smartphone to enable video when possible. You may have a family member that lives with you that can help. If possible, we also ask that you have a blood pressure cuff and scale at home to measure your blood pressure, heart rate and weight prior to your scheduled appointment. Patients with clinical needs that need an in-person evaluation and testing will still be able to come to the office if absolutely necessary. If you have any questions, feel free to call our office.     YOUR PROVIDER WILL BE USING THE FOLLOWING PLATFORM TO COMPLETE YOUR VISIT: Mychart . IF USING MYCHART - How to Download the MyChart App to Your SmartPhone   - If Apple, go to App Store and type in MyChart in the search bar and download the app. If Android, ask patient to go to Google Play Store and type in MyChart in the search bar and download the app. The app is free but as with any other app downloads, your phone may require you to verify saved payment information or Apple/Android password.  - You will need to then log into the app with your MyChart username and password, and select [facility] as your healthcare provider to link the account.  - When it is time for your visit, go to the MyChart app, find appointments, and click Begin Video Visit. Be sure to Select Allow for your device to access  the Microphone and Camera for your visit. You will then be connected, and your provider will be with you shortly.  **If you have any issues connecting or need assistance, please contact MyChart service desk [phone] ([phone])**  **If using a computer, in order to ensure the best quality for your visit, you will need to use either of the following Internet Browsers: Google Chrome or Microsoft Edge**  . IF USING DOXIMITY or [email or web address].ME - The staff will give you instructions on receiving your link to join the meeting the day of your visit.      2-3 DAYS BEFORE YOUR APPOINTMENT  You will receive a telephone call from one of our [facility] team members - your caller ID may say "Unknown caller." If this is a video visit, we will walk you through how to get the video launched on your phone. We will remind you check your blood pressure, heart rate and weight prior to your scheduled appointment. If you have an Apple Watch or Kardia, please upload any pertinent ECG strips the day before or morning of your appointment to MyChart. Our staff will also make sure you have reviewed the consent and agree to move forward with your scheduled tele-health visit.     THE DAY OF YOUR APPOINTMENT  Approximately 15 minutes prior to your scheduled appointment, you will receive a telephone call from one of Heart[facility] team - your caller ID may say "Unknown caller."  Our   staff will confirm medications, vital signs for the day and any symptoms you may be experiencing. Please have this information available prior to the time of visit start. It may also be helpful for you to have a pad of paper and pen handy for any instructions given during your visit. They will also walk you through joining the smartphone meeting if this is a video visit.    CONSENT FOR TELE-HEALTH VISIT - PLEASE REVIEW  I hereby voluntarily request, consent and authorize [facility] and its employed or contracted physicians, physician assistants,  nurse practitioners or other licensed health care professionals (the Practitioner), to provide me with telemedicine health care services (the "Services") as deemed necessary by the treating Practitioner. I acknowledge and consent to receive the Services by the Practitioner via telemedicine. I understand that the telemedicine visit will involve communicating with the Practitioner through live audiovisual communication technology and the disclosure of certain medical information by electronic transmission. I acknowledge that I have been given the opportunity to request an in-person assessment or other available alternative prior to the telemedicine visit and am voluntarily participating in the telemedicine visit.  I understand that I have the right to withhold or withdraw my consent to the use of telemedicine in the course of my care at any time, without affecting my right to future care or treatment, and that the Practitioner or I may terminate the telemedicine visit at any time. I understand that I have the right to inspect all information obtained and/or recorded in the course of the telemedicine visit and may receive copies of available information for a reasonable fee.  I understand that some of the potential risks of receiving the Services via telemedicine include:  . Delay or interruption in medical evaluation due to technological equipment failure or disruption; . Information transmitted may not be sufficient (e.g. poor resolution of images) to allow for appropriate medical decision making by the Practitioner; and/or  . In rare instances, security protocols could fail, causing a breach of personal health information.  Furthermore, I acknowledge that it is my responsibility to provide information about my medical history, conditions and care that is complete and accurate to the best of my ability. I acknowledge that Practitioner's advice, recommendations, and/or decision may be based on factors not  within their control, such as incomplete or inaccurate data provided by me or distortions of diagnostic images or specimens that may result from electronic transmissions. I understand that the practice of medicine is not an exact science and that Practitioner makes no warranties or guarantees regarding treatment outcomes. I acknowledge that I will receive a copy of this consent concurrently upon execution via email to the email address I last provided but may also request a printed copy by calling the office of [facility].    I understand that my insurance will be billed for this visit.   I have read or had this consent read to me. . I understand the contents of this consent, which adequately explains the benefits and risks of the Services being provided via telemedicine.  . I have been provided ample opportunity to ask questions regarding this consent and the Services and have had my questions answered to my satisfaction. . I give my informed consent for the services to be provided through the use of telemedicine in my medical care  By participating in this telemedicine visit I agree to the above.  

## 2019-09-24 NOTE — Patient Instructions (Signed)
Medication Instructions:  Your physician has recommended you make the following change in your medication:   1. START meclizine 25 mg tablet: Take 1/2 tablet (12.5 mg total) by mouth three times a day AS NEEDED for dizziness or vertigo. Please follow up with PCP if this does not improve  2. You may take an extra 5 mg daily of amlodipine AS NEEDED for elevated Blood Pressure  *If you need a refill on your cardiac medications before your next appointment, please call your pharmacy*   Lab Work: None ordered  If you have labs (blood work) drawn today and your tests are completely normal, you will receive your results only by: Marland Kitchen MyChart Message (if you have MyChart) OR . A paper copy in the mail If you have any lab test that is abnormal or we need to change your treatment, we will call you to review the results.   Testing/Procedures: None ordered   Follow-Up: At [facility], you and your health needs are our priority.  As part of our continuing mission to provide you with exceptional heart care, we have created designated Provider Care Teams.  These Care Teams include your primary Cardiologist (physician) and Advanced Practice Providers (APPs -  Physician Assistants and Nurse Practitioners) who all work together to provide you with the care you need, when you need it.  We recommend signing up for the patient portal called "MyChart".  Sign up information is provided on this After Visit Summary.  MyChart is used to connect with patients for Virtual Visits (Telemedicine).  Patients are able to view lab/test results, encounter notes, upcoming appointments, etc.  Non-urgent messages can be sent to your provider as well.   To learn more about what you can do with MyChart, go to [email or web address].    Your next appointment:   4-6 week(s)  The format for your next appointment:   Either In Person or Virtual  Provider:   Fransico Him, MD or Ermalinda Barrios, PA-C   Other  Instructions Two Gram Sodium Diet 2000 mg  What is Sodium? Sodium is a mineral found naturally in many foods. The most significant source of sodium in the diet is table salt, which is about 40% sodium.  Processed, convenience, and preserved foods also contain a large amount of sodium.  The body needs only 500 mg of sodium daily to function,  A normal diet provides more than enough sodium even if you do not use salt.  Why Limit Sodium? A build up of sodium in the body can cause thirst, increased blood pressure, shortness of breath, and water retention.  Decreasing sodium in the diet can reduce edema and risk of heart attack or stroke associated with high blood pressure.  Keep in mind that there are many other factors involved in these health problems.  Heredity, obesity, lack of exercise, cigarette smoking, stress and what you eat all play a role.  General Guidelines:  Do not add salt at the table or in cooking.  One teaspoon of salt contains over 2 grams of sodium.  Read food labels  Avoid processed and convenience foods  Ask your dietitian before eating any foods not dicussed in the menu planning guidelines  Consult your physician if you wish to use a salt substitute or a sodium containing medication such as antacids.  Limit milk and milk products to 16 oz (2 cups) per day.  Shopping Hints:  READ LABELS!! "Dietetic" does not necessarily mean low sodium.  Salt and other sodium ingredients  are often added to foods during processing.   Menu Planning Guidelines Food Group Choose More Often Avoid  Beverages (see also the milk group All fruit juices, low-sodium, salt-free vegetables juices, low-sodium carbonated beverages Regular vegetable or tomato juices, commercially softened water used for drinking or cooking  Breads and Cereals Enriched white, wheat, rye and pumpernickel bread, hard rolls and dinner rolls; muffins, cornbread and waffles; most dry cereals, cooked cereal without added  salt; unsalted crackers and breadsticks; low sodium or homemade bread crumbs Bread, rolls and crackers with salted tops; quick breads; instant hot cereals; pancakes; commercial bread stuffing; self-rising flower and biscuit mixes; regular bread crumbs or cracker crumbs  Desserts and Sweets Desserts and sweets mad with mild should be within allowance Instant pudding mixes and cake mixes  Fats Butter or margarine; vegetable oils; unsalted salad dressings, regular salad dressings limited to 1 Tbs; light, sour and heavy cream Regular salad dressings containing bacon fat, bacon bits, and salt pork; snack dips made with instant soup mixes or processed cheese; salted nuts  Fruits Most fresh, frozen and canned fruits Fruits processed with salt or sodium-containing ingredient (some dried fruits are processed with sodium sulfites        Vegetables Fresh, frozen vegetables and low- sodium canned vegetables Regular canned vegetables, sauerkraut, pickled vegetables, and others prepared in brine; frozen vegetables in sauces; vegetables seasoned with ham, bacon or salt pork  Condiments, Sauces, Miscellaneous  Salt substitute with physician's approval; pepper, herbs, spices; vinegar, lemon or lime juice; hot pepper sauce; garlic powder, onion powder, low sodium soy sauce (1 Tbs.); low sodium condiments (ketchup, chili sauce, mustard) in limited amounts (1 tsp.) fresh ground horseradish; unsalted tortilla chips, pretzels, potato chips, popcorn, salsa (1/4 cup) Any seasoning made with salt including garlic salt, celery salt, onion salt, and seasoned salt; sea salt, rock salt, kosher salt; meat tenderizers; monosodium glutamate; mustard, regular soy sauce, barbecue, sauce, chili sauce, teriyaki sauce, steak sauce, Worcestershire sauce, and most flavored vinegars; canned gravy and mixes; regular condiments; salted snack foods, olives, picles, relish, horseradish sauce, catsup   Food preparation: Try these  seasonings Meats:    Pork Sage, onion Serve with applesauce  Chicken Poultry seasoning, thyme, parsley Serve with cranberry sauce  Lamb Curry powder, rosemary, garlic, thyme Serve with mint sauce or jelly  Veal Marjoram, basil Serve with current jelly, cranberry sauce  Beef Pepper, bay leaf Serve with dry mustard, unsalted chive butter  Fish [place] leaf, dill Serve with unsalted lemon butter, unsalted parsley butter  Vegetables:    Asparagus Lemon juice   Broccoli Lemon juice   Carrots Mustard dressing parsley, mint, nutmeg, glazed with unsalted butter and sugar   Green beans Marjoram, lemon juice, nutmeg,dill seed   Tomatoes Basil, marjoram, onion   Spice /blend for [employer]" 4 tsp ground thyme 1 tsp ground sage 3 tsp ground rosemary 4 tsp ground marjoram   Test your knowledge 1. A product that says "Salt Free" may still contain sodium. True or False 2. Garlic Powder and Hot Pepper Sauce an be used as alternative seasonings.True or False 3. Processed foods have more sodium than fresh foods.  True or False 4. Canned Vegetables have less sodium than froze True or False  WAYS TO DECREASE YOUR SODIUM INTAKE 1. Avoid the use of added salt in cooking and at the table.  Table salt (and other prepared seasonings which contain salt) is probably one of the greatest sources of sodium in the diet.  Unsalted foods can  gain flavor from the sweet, sour, and butter taste sensations of herbs and spices.  Instead of using salt for seasoning, try the following seasonings with the foods listed.  Remember: how you use them to enhance natural food flavors is limited only by your creativity... Allspice-Meat, fish, eggs, fruit, peas, red and yellow vegetables Almond Extract-Fruit baked goods Anise Seed-Sweet breads, fruit, carrots, beets, cottage cheese, cookies (tastes like licorice) Basil-Meat, fish, eggs, vegetables, rice, vegetables salads, soups, sauces Bay Leaf-Meat, fish, stews, poultry Burnet-Salad,  vegetables (cucumber-like flavor) Caraway Seed-Bread, cookies, cottage cheese, meat, vegetables, cheese, rice Cardamon-Baked goods, fruit, soups Celery Powder or seed-Salads, salad dressings, sauces, meatloaf, soup, bread.Do not use  celery salt Chervil-Meats, salads, fish, eggs, vegetables, cottage cheese (parsley-like flavor) Chili Power-Meatloaf, chicken cheese, corn, eggplant, egg dishes Chives-Salads cottage cheese, egg dishes, soups, vegetables, sauces Cilantro-Salsa, casseroles Cinnamon-Baked goods, fruit, pork, lamb, chicken, carrots Cloves-Fruit, baked goods, fish, pot roast, green beans, beets, carrots Coriander-Pastry, cookies, meat, salads, cheese (lemon-orange flavor) Cumin-Meatloaf, fish,cheese, eggs, cabbage,fruit pie (caraway flavor) [employer], fruit, eggs, fish, poultry, cottage cheese, vegetables Dill Seed-Meat, cottage cheese, poultry, vegetables, fish, salads, bread Fennel Seed-Bread, cookies, apples, pork, eggs, fish, beets, cabbage, cheese, Licorice-like flavor Garlic-(buds or powder) Salads, meat, poultry, fish, bread, butter, vegetables, potatoes.Do not  use garlic salt Ginger-Fruit, vegetables, baked goods, meat, fish, poultry Horseradish Root-Meet, vegetables, butter Lemon Juice or Extract-Vegetables, fruit, tea, baked goods, fish salads Mace-Baked goods fruit, vegetables, fish, poultry (taste like nutmeg) Maple Extract-Syrups Marjoram-Meat, chicken, fish, vegetables, breads, green salads (taste like Sage) Mint-Tea, lamb, sherbet, vegetables, desserts, carrots, cabbage Mustard, Dry or Seed-Cheese, eggs, meats, vegetables, poultry Nutmeg-Baked goods, fruit, chicken, eggs, vegetables, desserts Onion Powder-Meat, fish, poultry, vegetables, cheese, eggs, bread, rice salads (Do not use   Onion salt) Orange Extract-Desserts, baked goods Oregano-Pasta, eggs, cheese, onions, pork, lamb, fish, chicken, vegetables, green salads Paprika-Meat, fish, poultry, eggs,  cheese, vegetables Parsley Flakes-Butter, vegetables, meat fish, poultry, eggs, bread, salads (certain forms may   Contain sodium Pepper-Meat fish, poultry, vegetables, eggs Peppermint Extract-Desserts, baked goods Poppy Seed-Eggs, bread, cheese, fruit dressings, baked goods, noodles, vegetables, cottage  [employer], poultry, meat, fish, cauliflower, turnips,eggs bread Saffron-Rice, bread, veal, chicken, fish, eggs Sage-Meat, fish, poultry, onions, eggplant, tomateos, pork, stews Savory-Eggs, salads, poultry, meat, rice, vegetables, soups, pork Tarragon-Meat, poultry, fish, eggs, butter, vegetables (licorice-like flavor)  Thyme-Meat, poultry, fish, eggs, vegetables, (clover-like flavor), sauces, soups Tumeric-Salads, butter, eggs, fish, rice, vegetables (saffron-like flavor) Vanilla Extract-Baked goods, candy Vinegar-Salads, vegetables, meat marinades Walnut Extract-baked goods, candy  2. Choose your Foods Wisely   The following is a list of foods to avoid which are high in sodium:  Meats-Avoid all smoked, canned, salt cured, dried and kosher meat and fish as well as Anchovies   [employer] meats:Bologna, Liverwurst, Pastrami Canned meat or fish  Marinated herring Caviar    Pepperoni Corned Beef   Pizza Dried chipped beef  Salami Frozen breaded fish or meat Salt pork Frankfurters or hot dogs  Sardines Gefilte fish   Sausage Ham (boiled ham, Proscuitto Smoked butt    spiced ham)   Spam      TV Dinners Vegetables Canned vegetables (Regular) Relish Canned mushrooms  Sauerkraut Olives    Tomato juice Pickles  Bakery and Dessert Products Canned puddings  Cream pies Cheesecake   Decorated cakes Cookies  Beverages/Juices Tomato juice, regular  Gatorade   V-8 vegetable juice, regular  Breads and Cereals Biscuit mixes   Salted potato chips, corn  chips, pretzels Bread stuffing mixes  Salted crackers and rolls Pancake and  waffle mixes Self-rising flour  Seasonings Accent    Meat sauces Barbecue sauce  Meat tenderizer Catsup    Monosodium glutamate (MSG) Celery salt   Onion salt Chili sauce   Prepared mustard Garlic salt   Salt, seasoned salt, sea salt Gravy mixes   Soy sauce Horseradish   Steak sauce Ketchup   Tartar sauce Lite salt    Teriyaki sauce Marinade mixes   Worcestershire sauce  Others Baking powder   Cocoa and cocoa mixes Baking soda   Commercial casserole mixes Candy-caramels, chocolate  Dehydrated soups    Bars, fudge,nougats  Instant rice and pasta mixes Canned broth or soup  Maraschino cherries Cheese, aged and processed cheese and cheese spreads  Learning Assessment Quiz  Indicated T (for True) or F (for False) for each of the following statements:  1. _____ Fresh fruits and vegetables and unprocessed grains are generally low in sodium 2. _____ Water may contain a considerable amount of sodium, depending on the source 3. _____ You can always tell if a food is high in sodium by tasting it 4. _____ Certain laxatives my be high in sodium and should be avoided unless prescribed   by a physician or pharmacist 5. _____ Salt substitutes may be used freely by anyone on a sodium restricted diet 6. _____ Sodium is present in table salt, food additives and as a natural component of   most foods 7. _____ Table salt is approximately 90% sodium 8. _____ Limiting sodium intake may help prevent excess fluid accumulation in the body 9. _____ On a sodium-restricted diet, seasonings such as bouillon soy sauce, and    cooking wine should be used in place of table salt 10. _____ On an ingredient list, a product which lists monosodium glutamate as the first   ingredient is an appropriate food to include on a low sodium diet  Circle the best answer(s) to the following statements (Hint: there may be more than one correct answer)  11. On a low-sodium diet, some acceptable snack items are:    A.  Olives  F. Bean dip   K. Grapefruit juice    B. Salted Pretzels G. Commercial Popcorn   L. Canned peaches    C. Carrot Sticks  H. Bouillon   M. Unsalted nuts   D. [place] fries  I. Peanut butter crackers N. Salami   E. Sweet pickles J. Tomato Juice   O. Pizza  12.  Seasonings that may be used freely on a reduced - sodium diet include   A. Lemon wedges F.Monosodium glutamate K. Celery seed    B.Soysauce   G. Pepper   L. Mustard powder   C. Sea salt  H. Cooking wine  M. Onion flakes   D. Vinegar  E. Prepared horseradish N. Salsa   E. Sage   J. Worcestershire sauce  O. Chutney

## 2019-09-25 ENCOUNTER — Other Ambulatory Visit: Payer: Self-pay

## 2019-12-31 ENCOUNTER — Other Ambulatory Visit: Payer: Self-pay | Admitting: Family Medicine

## 2019-12-31 NOTE — Telephone Encounter (Signed)
[facility] is requesting to fill pt hydroxzine please advise [facility]

## 2020-01-30 ENCOUNTER — Ambulatory Visit (INDEPENDENT_AMBULATORY_CARE_PROVIDER_SITE_OTHER): Payer: Managed Care, Other (non HMO) | Admitting: Medical

## 2020-01-30 ENCOUNTER — Encounter: Payer: Self-pay | Admitting: Medical

## 2020-01-30 ENCOUNTER — Other Ambulatory Visit: Payer: Self-pay

## 2020-01-30 VITALS — BP 124/82 | HR 67 | Ht 71.5 in | Wt 216.6 lb

## 2020-01-30 DIAGNOSIS — Z113 Encounter for screening for infections with a predominantly sexual mode of transmission: Secondary | ICD-10-CM | POA: Diagnosis not present

## 2020-01-30 DIAGNOSIS — N529 Male erectile dysfunction, unspecified: Secondary | ICD-10-CM | POA: Diagnosis not present

## 2020-01-30 DIAGNOSIS — R7989 Other specified abnormal findings of blood chemistry: Secondary | ICD-10-CM

## 2020-01-30 DIAGNOSIS — N5082 Scrotal pain: Secondary | ICD-10-CM | POA: Diagnosis not present

## 2020-01-30 DIAGNOSIS — R634 Abnormal weight loss: Secondary | ICD-10-CM | POA: Insufficient documentation

## 2020-01-30 DIAGNOSIS — I10 Essential (primary) hypertension: Secondary | ICD-10-CM | POA: Diagnosis not present

## 2020-01-30 DIAGNOSIS — R42 Dizziness and giddiness: Secondary | ICD-10-CM

## 2020-01-30 NOTE — Progress Notes (Signed)
Subjective:  Curtis Wallace. is a 54 y.o. male who presents for Chief Complaint  Patient presents with  . Groin Pain    x 2 weeks-pain around testicle-unable to get an erection-fluid at tip      Here for several symptoms of concern  He notes some dizziness that he gets occasionally in the last month or 2.  It is brief, random, does not last very long.  However he denies chest pain, shortness of breath, leg swelling, numbness or tingling.  No headaches.  He noted today on our weight here that he has lost weight since the first of the year.  He did not realize he lost that much weight.  He does not necessarily feel its completely unusual since he is on certain supplements and exercising.  Hypertension-compliant with his blood pressure medicines without complaint.  He has a home BP cuff, but he normally gets high readings at home.  For the last 2 weeks he has gotten intermittent testicle discomfort.  No swelling or redness of the testicles, no fever no nausea or vomiting no penile discharge no blood in the urine.  He does not feel any new mass.  He is married but he does disclose today having a partner with unprotected sex recently in the last month  He has a history of erectile dysfunction but in the past he is used sildenafil more to help support his erections.  Lately though he has been having problems getting and keeping erections despite the sildenafil  No other aggravating or relieving factors.    No other c/o.  The following portions of the patient's history were reviewed and updated as appropriate: allergies, current medications, past family history, past medical history, past social history, past surgical history and problem list.  ROS Otherwise as in subjective above  Objective: BP 124/82   Pulse 67   Ht 5' 11.5" (1.816 m)   Wt (!) 216 lb 9.6 oz (98.2 kg)   SpO2 96%   BMI 29.79 kg/m   General appearance: alert, no distress, well developed, well nourished Neck:  supple, no lymphadenopathy, no thyromegaly, no masses, no bruits Heart: RRR, normal S1, S2, no murmurs Lungs: CTA bilaterally, no wheezes, rhonchi, or rales Abdomen: +bs, soft, non tender, non distended, no masses, no hepatomegaly, no splenomegaly, no abdominal or inguinal bruits Pulses: 2+ radial pulses, 2+ pedal pulses, normal cap refill Ext: no edema GU: Normal male, circumcised, no mass no lesions, no obvious hernia, nontender on exam, no swelling   Assessment: Encounter Diagnoses  Name Primary?  . Essential hypertension, benign Yes  . Scrotal pain   . Erectile dysfunction, unspecified erectile dysfunction type   . Screen for STD (sexually transmitted disease)   . Dizziness   . Weight loss   . Elevated serum creatinine      Plan: We discussed his variety of concerns and symptoms that may or may not be related  Discussed possible differential  Hypertension-blood pressure is at goal today.  He notes that his home cuff is reading higher than this.  I advise he check his cuff along beside blood pressure [facility] or [facility] or come here for comparison.  We did discuss that he has lost significant weight since earlier in the year which can impact blood pressure.  He mentions some dizziness.  There is the possibility of vasovagal symptoms or drop in blood pressure with weight loss.  Thus we need to get a better idea of his blood pressure  readings.  For now continue same medication  Scrotal pain-we discussed possible differential.  Exam unremarkable.  Screening labs as below.  If STD screening labs come back negative consider treatment for prostatitis or orchitis  Erectile dysfunction-he has a history of this but worse lately.  We discussed possible etiologies.  Consider dose adjustment on sildenafil pending labs  Dizziness-unclear etiology.  He has had this a few times but not currently.  Vital signs reviewed.  Given the weight loss from earlier in the year and the fact that he is  on blood pressure medication, there is the possibility of vasovagal cause, but discussed other possibilities.  We will check some labs today  Weight loss-he is not entirely sure if this is intentional or not.  He is active and exercising, has been taking a supplement he thinks could be related.  He is up-to-date on Cologuard that was negative last year and his last PSA was normal this past year.  He has no other worrisome symptoms such as fevers night sweats or other.  Impaired creatinine-recheck labs today   Dyquan was seen today for groin pain.  Diagnoses and all orders for this visit:  Essential hypertension, benign -     Comprehensive metabolic panel -     CBC  Scrotal pain -     Comprehensive metabolic panel -     CBC  Erectile dysfunction, unspecified erectile dysfunction type  Screen for STD (sexually transmitted disease) -     HIV Antibody (routine testing w rflx) -     RPR -     GC/Chlamydia Probe Amp  Dizziness -     Comprehensive metabolic panel -     CBC  Weight loss  Elevated serum creatinine    Follow up: pending labs

## 2020-01-31 LAB — CBC
Hematocrit: 47.9 % (ref 37.5–51.0)
Hemoglobin: 16 g/dL (ref 13.0–17.7)
MCH: 26.8 pg (ref 26.6–33.0)
MCHC: 33.4 g/dL (ref 31.5–35.7)
MCV: 80 fL (ref 79–97)
Platelets: 275 10*3/uL (ref 150–450)
RBC: 5.97 x10E6/uL — ABNORMAL HIGH (ref 4.14–5.80)
RDW: 14.7 % (ref 11.6–15.4)
WBC: 4.8 10*3/uL (ref 3.4–10.8)

## 2020-01-31 LAB — COMPREHENSIVE METABOLIC PANEL
ALT: 24 IU/L (ref 0–44)
AST: 18 IU/L (ref 0–40)
Albumin/Globulin Ratio: 1.7 (ref 1.2–2.2)
Albumin: 4.8 g/dL (ref 3.8–4.9)
Alkaline Phosphatase: 68 IU/L (ref 48–121)
BUN/Creatinine Ratio: 8 — ABNORMAL LOW (ref 9–20)
BUN: 11 mg/dL (ref 6–24)
Bilirubin Total: 0.5 mg/dL (ref 0.0–1.2)
CO2: 26 mmol/L (ref 20–29)
Calcium: 9.7 mg/dL (ref 8.7–10.2)
Chloride: 97 mmol/L (ref 96–106)
Creatinine, Ser: 1.38 mg/dL — ABNORMAL HIGH (ref 0.76–1.27)
GFR calc Af Amer: 66 mL/min/{1.73_m2} (ref >59)
GFR calc non Af Amer: 57 mL/min/{1.73_m2} — ABNORMAL LOW (ref >59)
Globulin, Total: 2.8 g/dL (ref 1.5–4.5)
Glucose: 86 mg/dL (ref 65–99)
Potassium: 5.2 mmol/L (ref 3.5–5.2)
Sodium: 140 mmol/L (ref 134–144)
Total Protein: 7.6 g/dL (ref 6.0–8.5)

## 2020-01-31 LAB — GC/CHLAMYDIA PROBE AMP
Chlamydia trachomatis, NAA: NEGATIVE
Neisseria Gonorrhoeae by PCR: NEGATIVE

## 2020-01-31 LAB — RPR: RPR Ser Ql: NONREACTIVE

## 2020-01-31 LAB — HIV ANTIBODY (ROUTINE TESTING W REFLEX): HIV Screen 4th Generation wRfx: NONREACTIVE

## 2020-02-02 ENCOUNTER — Telehealth: Payer: Self-pay | Admitting: Medical

## 2020-02-02 ENCOUNTER — Other Ambulatory Visit: Payer: Self-pay | Admitting: Medical

## 2020-02-02 MED ORDER — SULFAMETHOXAZOLE-TRIMETHOPRIM 800-160 MG PO TABS: 1.0000 | ORAL_TABLET | Freq: Two times a day (BID) | ORAL | 0 refills | Status: DC

## 2020-02-02 MED ORDER — SILDENAFIL CITRATE 100 MG PO TABS
ORAL_TABLET | ORAL | 1 refills | Status: DC
Start: 2020-02-02 — End: 2020-08-27

## 2020-02-02 NOTE — Telephone Encounter (Signed)
I called and spoke to patient.  We discussed his lab results and findings.  He will monitor blood pressures and compare them with [facility] as there was a question on whether his cuff is accurate.  He is in fact taking amlodipine and metoprolol.  He is going to use a trial of Viagra if his blood pressures are not running too low.  We discussed proper use of Viagra, risk and benefits of medication.  He is going to begin Bactrim antibiotic for scrotal discomfort possible orchitis or prostatitis.  He will follow up with Dr. Susann Givens soon for recheck on blood pressure, symptoms, and creatinine.  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  He may want to consider hemoglobin electrophoresis to confirm going forward.  This is a question he brought up.  We discussed his abnormal creatinine x1 year likely caused by hypertension longstanding.

## 2020-02-29 ENCOUNTER — Other Ambulatory Visit: Payer: Self-pay | Admitting: Family Medicine

## 2020-03-01 NOTE — Telephone Encounter (Signed)
Is this okay to refill? 

## 2020-06-07 ENCOUNTER — Other Ambulatory Visit: Payer: Self-pay | Admitting: Medical

## 2020-08-27 ENCOUNTER — Ambulatory Visit
Admission: RE | Admit: 2020-08-27 | Discharge: 2020-08-27 | Disposition: A | Discharge status: Home / Self Care | Payer: Managed Care, Other (non HMO) | Source: Ambulatory Visit | Attending: Medical | Admitting: Medical

## 2020-08-27 ENCOUNTER — Other Ambulatory Visit: Payer: Self-pay

## 2020-08-27 ENCOUNTER — Ambulatory Visit (INDEPENDENT_AMBULATORY_CARE_PROVIDER_SITE_OTHER): Payer: Managed Care, Other (non HMO) | Admitting: Medical

## 2020-08-27 ENCOUNTER — Encounter: Payer: Self-pay | Admitting: Medical

## 2020-08-27 VITALS — BP 132/78 | HR 88 | Ht 71.5 in | Wt 223.6 lb

## 2020-08-27 DIAGNOSIS — R7989 Other specified abnormal findings of blood chemistry: Secondary | ICD-10-CM | POA: Diagnosis not present

## 2020-08-27 DIAGNOSIS — M25562 Pain in left knee: Secondary | ICD-10-CM

## 2020-08-27 DIAGNOSIS — I10 Essential (primary) hypertension: Secondary | ICD-10-CM

## 2020-08-27 DIAGNOSIS — M25462 Effusion, left knee: Secondary | ICD-10-CM | POA: Diagnosis not present

## 2020-08-27 DIAGNOSIS — M7662 Achilles tendinitis, left leg: Secondary | ICD-10-CM | POA: Diagnosis not present

## 2020-08-27 DIAGNOSIS — G8929 Other chronic pain: Secondary | ICD-10-CM

## 2020-08-27 DIAGNOSIS — IMO0002 Reserved for concepts with insufficient information to code with codable children: Secondary | ICD-10-CM

## 2020-08-27 LAB — POCT URINALYSIS DIP (PROADVANTAGE DEVICE)
Bilirubin, UA: NEGATIVE
Blood, UA: NEGATIVE
Glucose, UA: NEGATIVE mg/dL
Ketones, POC UA: NEGATIVE mg/dL
Leukocytes, UA: NEGATIVE
Nitrite, UA: NEGATIVE
Specific Gravity, Urine: 1.03
Urobilinogen, Ur: 0.2
pH, UA: 6 (ref 5.0–8.0)

## 2020-08-27 MED ORDER — AMLODIPINE BESYLATE 5 MG PO TABS: 5.0000 mg | ORAL_TABLET | Freq: Every day | ORAL | 3 refills | Status: DC

## 2020-08-27 NOTE — Progress Notes (Signed)
Subjective: Chief Complaint  Patient presents with  . Referral    Left knee Possible cyst in back of knee/fluid build up with pain.    Here for problems with left knee and achilles.    Been having pains in left knee for years.  Plays tennis and runs regularly, and over time has gotten progressive pain in left knee.  Often gets fullness in back of knee, concerned about bakers cyst.  Seems to fill up with fluid.  Sometimes gets pain and fullness around left side of left knee as well.    Also has trouble with achilles, pain in tendon.    Feels like he has RLS.  Often legs seem to want to move on their own, jumpy when lying down.   He has questions about his kidney marker from last visit and he notes that he is not currently taking any of his blood pressure medications.  He says that after last visit he temporary stop the blood pressure medicines when his pressure was low as we discussed but never started them back as his pressures at home have looked ok.  Denies chest pain, palpitations, edema no shortness of breath  Past Medical History:  Diagnosis Date  . Hypertension    No current outpatient medications on file prior to visit.   No current facility-administered medications on file prior to visit.    No other aggravating or relieving factors. No other complaint.   Objective: BP 132/78   Pulse 88   Ht 5' 11.5" (1.816 m)   Wt 223 lb 9.6 oz (101.4 kg)   SpO2 96%   BMI 30.75 kg/m   Gen: wd, wn, nad, African American male Left knee with obvious mild to moderate effusion of the knee with puffiness on the anterior medial left knee and posterior knee, tender over left lateral knee, there is slight laxity to varus and valgus stress with tenderness on the left lateral knee, mild discomfort with McMurray's, he is also tender over the Achilles tendon on the left, pain with plantar and dorsiflexion in the same area, otherwise foot and ankle nontender with no other deformity Legs  neurovascularly intact No other extremity edema      Assessment: Encounter Diagnoses  Name Primary?  . Achilles tendinitis of left lower extremity Yes  . Chronic pain of left knee   . Effusion of left knee joint   . Creatinine elevation   . Essential hypertension, benign      Plan: First of all I advised he come back and see his primary care provider Dr. Susann Givens soon particularly for some of his more chronic issues  Chronic knee pain left-go for x-ray, discussed possible differential.  He will use relative rest, ice water therapy 20 minutes on 20 minutes off, continue knee sleeve, avoid tennis and running until things have calmed down over the next 7 to 10 days.  Begin over-the-counter low-dose Aleve or ibuprofen as discussed for the next 3 to 5 days.  In general we want to avoid NSAIDs given his kidney marker but we will use this short-term  Achilles tendinitis-advise short-term rest for the next several days, can continue his ankle sleeve that he is using, can use bag of frozen peas for cold therapy 20 minutes on and off over the next 3 to 5 days, we discussed some stretches that he can do.  If not much improved within the next week then recheck   Hypertension-noncompliant with medication, may be have gotten confused after last  visit.  He will restart amlodipine.  He will stop or has stopped metoprolol temporarily since his blood pressures are looking closer to normal.  Recheck soon with Dr. Susann Givens  Elevated creatinine, likely chronic kidney disease due to high blood pressure.  He had a lot of questions about this today.  We will recheck the lab.  He may need an ultrasound of the kidney depending on lab recheck.  We discussed the goal to keep blood pressure at goal, to limit NSAIDs, to avoid soda, avoid dehydration.  We discussed long-term complications of uncontrolled high blood pressure   Jaxin was seen today for referral.  Diagnoses and all orders for this visit:  Achilles  tendinitis of left lower extremity  Chronic pain of left knee -     DG Knee Complete 4 Views Left; Future  Effusion of left knee joint -     DG Knee Complete 4 Views Left; Future  Creatinine elevation -     Renal Function Panel -     POCT Urinalysis DIP (Proadvantage Device)  Essential hypertension, benign -     Renal Function Panel -     POCT Urinalysis DIP (Proadvantage Device)  Other orders -     amLODipine (NORVASC) 5 MG tablet; Take 1 tablet (5 mg total) by mouth daily. May take an extra tablet daily as needed for elevated blood pressure   F/u pending labs, xray

## 2020-08-27 NOTE — Patient Instructions (Signed)
Please go to [facility] for your knee xray.   Their hours are 8am - 4:30 pm Monday - Friday.  Take your insurance card with you.  [facility] [phone]  [address], [address] [place], [place] [address]  [address] [place], [place] [address]  

## 2020-08-28 LAB — RENAL FUNCTION PANEL
Albumin: 4.7 g/dL (ref 3.8–4.9)
BUN/Creatinine Ratio: 12 (ref 9–20)
BUN: 16 mg/dL (ref 6–24)
CO2: 21 mmol/L (ref 20–29)
Calcium: 9.8 mg/dL (ref 8.7–10.2)
Chloride: 102 mmol/L (ref 96–106)
Creatinine, Ser: 1.37 mg/dL — ABNORMAL HIGH (ref 0.76–1.27)
GFR calc Af Amer: 66 mL/min/{1.73_m2} (ref >59)
GFR calc non Af Amer: 57 mL/min/{1.73_m2} — ABNORMAL LOW (ref >59)
Glucose: 79 mg/dL (ref 65–99)
Phosphorus: 3.4 mg/dL (ref 2.8–4.1)
Potassium: 5 mmol/L (ref 3.5–5.2)
Sodium: 141 mmol/L (ref 134–144)

## 2020-09-10 ENCOUNTER — Other Ambulatory Visit: Payer: Self-pay | Admitting: Medical

## 2020-09-10 NOTE — Telephone Encounter (Signed)
Checking with pt to see if he is still taking. KH

## 2020-09-21 ENCOUNTER — Other Ambulatory Visit: Payer: Self-pay

## 2020-09-21 ENCOUNTER — Encounter: Payer: Self-pay | Admitting: Family Medicine

## 2020-09-21 ENCOUNTER — Ambulatory Visit (INDEPENDENT_AMBULATORY_CARE_PROVIDER_SITE_OTHER): Payer: Managed Care, Other (non HMO) | Admitting: Family Medicine

## 2020-09-21 VITALS — BP 134/82 | HR 72 | Temp 98.2°F | Wt 222.2 lb

## 2020-09-21 DIAGNOSIS — I10 Essential (primary) hypertension: Secondary | ICD-10-CM

## 2020-09-21 DIAGNOSIS — M7662 Achilles tendinitis, left leg: Secondary | ICD-10-CM | POA: Diagnosis not present

## 2020-09-21 DIAGNOSIS — R7989 Other specified abnormal findings of blood chemistry: Secondary | ICD-10-CM

## 2020-09-21 DIAGNOSIS — M25562 Pain in left knee: Secondary | ICD-10-CM | POA: Diagnosis not present

## 2020-09-21 DIAGNOSIS — M25462 Effusion, left knee: Secondary | ICD-10-CM | POA: Diagnosis not present

## 2020-09-21 DIAGNOSIS — G8929 Other chronic pain: Secondary | ICD-10-CM

## 2020-09-21 NOTE — Progress Notes (Signed)
   Subjective:    Patient ID: Curtis Wallace., male    DOB: 07-03-1966, 55 y.o.   MRN: [id number]  HPI He is here for recheck for multiple issues.  He does have a history of Achilles tendinitis and is using some ankle supports for this and states that it does help a lot.  Continues have left-sided knee pain with an effusion.  Recent x-ray showed knee effusion but no other major difficulties.  States that he can function fairly well with physical activity but is notes more difficulty when he finishes.  Continues to be on blood pressure medication.  Does have questionable evidence of CTS.   Review of Systems     Objective:   Physical Exam Alert and in no distress.  Exam of the left knee does show questionable effusion.  Anterior drawer is negative.  Medial lateral collateral ligaments intact.  McMurray's testing causes discomfort laterally.  X-rays were reviewed.       Assessment & Plan:  Chronic pain of left knee - Plan: Ambulatory referral to Physical Therapy  Achilles tendinitis of left lower extremity  Effusion of left knee joint - Plan: Ambulatory referral to Physical Therapy  Creatinine elevation  Essential hypertension, benign I discussed the therapy for his left knee explaining that under the present circumstances physical therapy is most appropriate therapy for this.  If this is not successful, would consider giving him a steroid injection and if no benefit from that, refer to orthopedics.  He was comfortable with that. He will continue conservative care for the Achilles tendinitis. I then discussed his creatinine level explaining that we need to keep his blood pressure under good control which he seems to be doing a good job with. Greater than 30 minutes spent discussing all these issues and coordination of care.

## 2020-10-12 ENCOUNTER — Other Ambulatory Visit: Payer: Self-pay

## 2020-10-12 ENCOUNTER — Ambulatory Visit: Payer: Managed Care, Other (non HMO) | Attending: Family Medicine

## 2020-10-12 DIAGNOSIS — M25562 Pain in left knee: Secondary | ICD-10-CM | POA: Insufficient documentation

## 2020-10-12 DIAGNOSIS — G8929 Other chronic pain: Secondary | ICD-10-CM | POA: Diagnosis present

## 2020-10-12 DIAGNOSIS — M6281 Muscle weakness (generalized): Secondary | ICD-10-CM | POA: Insufficient documentation

## 2020-10-13 NOTE — Therapy (Signed)
[facility] [address] [place], [place], [address] Phone: [phone]   Fax:  [phone]  Physical Therapy Evaluation  Patient Details  Name: Curtis Wallace. MRN: [id number] Date of Birth: [birth year] Referring Provider (PT): [name], MD   Encounter Date: 10/12/2020   PT End of Session - 10/12/20 1404    Visit Number 1    Number of Visits 9    Date for PT Re-Evaluation 12/10/20    Authorization Type [employer]    PT Start Time 1406    PT Stop Time 1446    PT Time Calculation (min) 40 min    Activity Tolerance Patient tolerated treatment well;No increased pain    Behavior During Therapy WFL for tasks assessed/performed           Past Medical History:  Diagnosis Date  . Hypertension     Past Surgical History:  Procedure Laterality Date  . CIRCUMCISION      There were no vitals filed for this visit.    Subjective Assessment - 10/12/20 1408    Subjective Pt is a pleasant 55 y/o M who presents to PT with reports of L knee pain after recreational activity. Denies MOI and states he really does not have pain while cutting or running during tennis, but afterwards and at night he will have increased pain and swelling. He was also experiencing swelling and pain in his L achilles, but this has reduced with compression brace application. He also notes occasional weakness in L LE when ascending stairs as well as increased pain with prolonged sitting to his L knee.    Limitations Other (comment)   pain after recreational activity such as tennis   How long can you sit comfortably? hour and a half    How long can you stand comfortably? no issues    How long can you walk comfortably? no issues    Diagnostic tests CLINICAL DATA:  Left knee pain and swelling. Pain with running.     EXAM:  LEFT KNEE - COMPLETE 4+ VIEW     COMPARISON:  None.     FINDINGS:  Joint spaces are preserved. A small effusion is present. No acute  focal  abnormality is present.     IMPRESSION:  1. Small joint effusion, likely inflammatory.  2. No acute osseous abnormality.    Patient Stated Goals Pt wants to decreae L knee pain after recreational activity in order to improve comfort and functional mobility    Currently in Pain? No/denies    Pain Score 0-No pain    Pain Location --   when painful, notes this is L lateral knee   Pain Type Chronic pain    Pain Onset More than a month ago    Pain Frequency Intermittent    Aggravating Factors  after tennis or exercise, 3/10 at worst    Pain Relieving Factors stretching, rest              [facility] PT Assessment - 10/13/20 0001      Assessment   Medical Diagnosis M25.562,G89.29 (ICD-10-CM) - Chronic pain of left knee; M25.462 (ICD-10-CM) - Effusion of left knee joint    Referring Provider (PT) [name], MD    Hand Dominance Right      Precautions   Precautions None      Balance Screen   Has the patient fallen in the past 6 months No    Has the patient had a decrease  in activity level because of a fear of falling?  No    Is the patient reluctant to leave their home because of a fear of falling?  No      Home Environment   Living Environment Private residence    Type of Home House    Home Layout Two level    Alternate Level Stairs-Number of Steps 15    Alternate Level Stairs-Rails Left      Prior Function   Level of Independence Independent    Vocation Requirements patient notes increased pain when prolonged sitting      Observation/Other Assessments   Observations patient demonstrates decreased height of medial longitudinal arch in bilateral feet along with slight pronation    Focus on Therapeutic Outcomes (FOTO)  83%      Strength   Right Hip Flexion 5/5    Right Hip ABduction 5/5    Right Hip ADduction 5/5    Left Hip Flexion 5/5    Left Hip ABduction 4+/5    Left Hip ADduction 5/5    Right Knee Flexion 5/5    Right Knee Extension 5/5    Left Knee Flexion 4+/5     Left Knee Extension 5/5      Palpation   Palpation comment TTP to L lateral patellar pole, L fibular head                      Objective measurements completed on examination: See above findings.       [facility] Adult PT Treatment/Exercise - 10/13/20 0001      Knee/Hip Exercises: Stretches   Lobbyist Limitations prone x 30 sec L with strap      Knee/Hip Exercises: Supine   Bridges 10 reps    Straight Leg Raises 10 reps      Knee/Hip Exercises: Sidelying   Clams x 10 R sidelying blue tband      Ankle Exercises: Stretches   Gastroc Stretch 2 reps;30 seconds;Limitations    Gastroc Stretch Limitations with towel - LLE                  PT Education - 10/12/20 1647    Education Details education on condition, eval findings, FOTO, POC, and HEP    Person(s) Educated Patient    Methods Explanation;Demonstration;Handout    Comprehension Verbalized understanding;Returned demonstration            PT Short Term Goals - 10/13/20 1008      PT SHORT TERM GOAL #1   Title Pt will be compliant and knowledgeable with 90% of HEP in order to improve carryover    Baseline initial HEP given    Time 3    Period Weeks    Status New    Target Date 11/03/20      PT SHORT TERM GOAL #2   Title Pt will be able ot ambulate up/down 15 stairs with reciprocal gait and no increase in L knee pain in order to improve functional mobiit    Baseline instances of buckling of L knee with reciprocal gait    Time 3    Period Weeks    Status New    Target Date 11/03/20             PT Long Term Goals - 10/13/20 1010      PT LONG TERM GOAL #1   Title Pt will increase FOTO to no less than 86% in order to improve confidence and  functional mobility    Baseline 83% initial    Time 8    Period Weeks    Status New    Target Date 12/08/20      PT LONG TERM GOAL #2   Title Pt will self report L knee pain no greater than 1/10 at worst in order to improve comfort    Baseline 3/10  at worst    Time 8    Period Weeks    Status New    Target Date 12/08/20      PT LONG TERM GOAL #3   Title Pt will increase L LE hip abdknee flex MMT to 5/5 in order to improve functional ability and decrease pain    Baseline see flowsheet    Time 8    Period Weeks    Status New    Target Date 12/08/20                  Plan - 10/12/20 1537    Clinical Impression Statement Pt is a pleasant 56 y/o M who presents to PT with reports of L knee pain after recreational activity. Physical findings are consistent with MD impression, with pt demonstrating palpable tenderness in L lateral knee and discomfort with functional squat movements. Due to his recent L achilles pain and increased pronation in bilateral feet, unable to rule out contribution of foot/ankle biomechnics contrinbuting to L knee pain. Pt also demonstrate decreased muscle length in certain LE muscle groups, as evidenced by heel rise during squat and discomfort with PROM to bilateral hamstrings. He would benefit from skilled PT services working on decrease pain by improving muscle length and quad/hip abductor strength. PT also encouraged pt to visit [facility] and discuss possible orthotic inserts for improving foot biomechanics.    Personal Factors and Comorbidities Time since onset of injury/illness/exacerbation    Examination-Activity Limitations Stairs;Squat    Stability/Clinical Decision Making Stable/Uncomplicated    Clinical Decision Making Low    Rehab Potential Excellent    PT Frequency 1x / week    PT Duration 8 weeks    PT Treatment/Interventions ADLs/Self Care Home Management;Electrical Stimulation;Cryotherapy;Moist Heat;Gait training;Therapeutic activities;Therapeutic exercise;Dry needling;Passive range of motion;Joint Manipulations;Manual techniques;Neuromuscular re-education    PT Next Visit Plan assess response to initial HEP and SLS time, progress strengthening/stretching as able    PT Home Exercise Plan  [id number]    Consulted and Agree with Plan of Care Patient           Patient will benefit from skilled therapeutic intervention in order to improve the following deficits and impairments:  Pain,Decreased strength,Decreased range of motion  Visit Diagnosis: Muscle weakness (generalized) - Plan: PT plan of care cert/re-cert  Chronic pain of left knee - Plan: PT plan of care cert/re-cert     Problem List Patient Active Problem List   Diagnosis Date Noted  . Essential hypertension, benign 01/30/2020  . Screen for STD (sexually transmitted disease) 01/30/2020  . Dizziness 01/30/2020  . Scrotal pain 01/30/2020  . Erectile dysfunction 01/30/2020  . Weight loss 01/30/2020  . Elevated serum creatinine 01/30/2020    Eloy End, PT, DPT 10/13/20 10:26 AM  [facility] [address] [place], [place], [address] Phone: [phone]   Fax:  [phone]  Name: Curtis Wallace. MRN: [id number] Date of Birth: 01/24/1966

## 2020-12-14 ENCOUNTER — Ambulatory Visit: Payer: Managed Care, Other (non HMO)

## 2020-12-21 ENCOUNTER — Ambulatory Visit: Payer: Managed Care, Other (non HMO)

## 2020-12-28 ENCOUNTER — Ambulatory Visit: Payer: Managed Care, Other (non HMO)

## 2021-06-13 ENCOUNTER — Other Ambulatory Visit: Payer: Self-pay | Admitting: Medical

## 2021-06-22 ENCOUNTER — Telehealth: Payer: Self-pay

## 2021-06-22 NOTE — Telephone Encounter (Signed)
Called pt to find out if he still is taking his amlodipine and to advise he need to schedule a med check appointment. KH

## 2021-06-29 ENCOUNTER — Other Ambulatory Visit: Payer: Self-pay | Admitting: Medical

## 2021-06-30 ENCOUNTER — Telehealth: Payer: Self-pay

## 2021-06-30 NOTE — Telephone Encounter (Signed)
Pt called about refill Norvasc,  Advised should have refill for year, called [facility] and only had refilled twice.  [facility] [place].  Pt scheduled CPE when he will be back in [place] in February.

## 2021-07-01 ENCOUNTER — Other Ambulatory Visit: Payer: Self-pay | Admitting: Medical

## 2021-07-05 ENCOUNTER — Telehealth: Payer: Self-pay | Admitting: Family Medicine

## 2021-07-05 ENCOUNTER — Other Ambulatory Visit: Payer: Self-pay

## 2021-07-05 MED ORDER — AMLODIPINE BESYLATE 5 MG PO TABS: 5.0000 mg | ORAL_TABLET | Freq: Every day | ORAL | 0 refills | Status: DC

## 2021-07-05 NOTE — Telephone Encounter (Signed)
Done KH 

## 2021-07-05 NOTE — Telephone Encounter (Signed)
Pt called and is requesting a refill on his norvasc states he is out he has been taking 2 a day that is why he is out of his medicine He wants to know if he should be taking 1 or 2 a day [facility] to [id number] - [place], [place] - [address]

## 2021-08-03 ENCOUNTER — Other Ambulatory Visit: Payer: Self-pay | Admitting: Family Medicine

## 2021-08-09 ENCOUNTER — Ambulatory Visit (INDEPENDENT_AMBULATORY_CARE_PROVIDER_SITE_OTHER): Payer: BC Managed Care – PPO | Admitting: Family Medicine

## 2021-08-09 ENCOUNTER — Encounter: Payer: Self-pay | Admitting: Family Medicine

## 2021-08-09 ENCOUNTER — Other Ambulatory Visit: Payer: Self-pay

## 2021-08-09 VITALS — BP 120/76 | HR 70 | Temp 98.0°F | Ht 70.25 in | Wt 216.0 lb

## 2021-08-09 DIAGNOSIS — Z630 Problems in relationship with spouse or partner: Secondary | ICD-10-CM

## 2021-08-09 DIAGNOSIS — I10 Essential (primary) hypertension: Secondary | ICD-10-CM | POA: Diagnosis not present

## 2021-08-09 DIAGNOSIS — Z0000 Encounter for general adult medical examination without abnormal findings: Secondary | ICD-10-CM

## 2021-08-09 DIAGNOSIS — R7989 Other specified abnormal findings of blood chemistry: Secondary | ICD-10-CM

## 2021-08-09 DIAGNOSIS — N529 Male erectile dysfunction, unspecified: Secondary | ICD-10-CM

## 2021-08-09 LAB — POCT URINALYSIS DIP (PROADVANTAGE DEVICE)
Bilirubin, UA: NEGATIVE
Blood, UA: NEGATIVE
Glucose, UA: NEGATIVE mg/dL
Ketones, POC UA: NEGATIVE mg/dL
Leukocytes, UA: NEGATIVE
Nitrite, UA: NEGATIVE
Protein Ur, POC: 30 mg/dL — AB
Specific Gravity, Urine: 1.025
Urobilinogen, Ur: 0.2
pH, UA: 6 (ref 5.0–8.0)

## 2021-08-09 MED ORDER — AMLODIPINE BESYLATE 5 MG PO TABS: 5.0000 mg | ORAL_TABLET | Freq: Every day | ORAL | 3 refills | Status: DC

## 2021-08-09 NOTE — Progress Notes (Signed)
° °  Subjective:    Patient ID: Curtis Wallace., male    DOB: 15-May-1966, 56 y.o.   MRN: [id number]  HPI He is here for complete examination.  He now states that he is in the process of going through a separation.  He feels fairly good about this.  He states that the communication was not there and the relationship was becoming more toxic.  He does feel that this is the right thing to do.  He does have hypertension and continues on amlodipine and is having no difficulty with that.  His work is going well.  He drinks less than 1 beverage per day.  He works out regularly.  Does not smoke.  Presently he is not dating anyone and has had difficulty with ADD in the past.  Presently he is not in need of any medications.  Otherwise his family and social history as well as health maintenance immunizations was reviewed.  Review of Systems  All other systems reviewed and are negative.     Objective:   Physical Exam Alert and in no distress. Tympanic membranes and canals are normal. Pharyngeal area is normal. Neck is supple without adenopathy or thyromegaly. Cardiac exam shows a regular sinus rhythm without murmurs or gallops. Lungs are clear to auscultation.        Assessment & Plan:  Routine general medical examination at a health care facility - Plan: CBC with Differential/Platelet, Comprehensive metabolic panel, Lipid panel, POCT Urinalysis DIP (Proadvantage Device)  Essential hypertension, benign - Plan: amLODipine (NORVASC) 5 MG tablet  Erectile dysfunction, unspecified erectile dysfunction type  Elevated serum creatinine - Plan: Comprehensive metabolic panel  Marital stress Discussed in detail the Marital stress with him.  He continues to have a good handle on this.  He is already involved a Chief Executive Officer in this and so far things are going very smoothly.

## 2021-08-10 ENCOUNTER — Encounter: Payer: Self-pay | Admitting: Family Medicine

## 2021-08-10 ENCOUNTER — Telehealth: Payer: Self-pay

## 2021-08-10 LAB — COMPREHENSIVE METABOLIC PANEL
ALT: 20 IU/L (ref 0–44)
AST: 23 IU/L (ref 0–40)
Albumin/Globulin Ratio: 1.5 (ref 1.2–2.2)
Albumin: 4.8 g/dL (ref 3.8–4.9)
Alkaline Phosphatase: 65 IU/L (ref 44–121)
BUN/Creatinine Ratio: 12 (ref 9–20)
BUN: 16 mg/dL (ref 6–24)
Bilirubin Total: 0.3 mg/dL (ref 0.0–1.2)
CO2: 24 mmol/L (ref 20–29)
Calcium: 9.5 mg/dL (ref 8.7–10.2)
Chloride: 100 mmol/L (ref 96–106)
Creatinine, Ser: 1.36 mg/dL — ABNORMAL HIGH (ref 0.76–1.27)
Globulin, Total: 3.1 g/dL (ref 1.5–4.5)
Glucose: 89 mg/dL (ref 70–99)
Potassium: 4.9 mmol/L (ref 3.5–5.2)
Sodium: 138 mmol/L (ref 134–144)
Total Protein: 7.9 g/dL (ref 6.0–8.5)
eGFR: 61 mL/min/{1.73_m2} (ref >59)

## 2021-08-10 LAB — LIPID PANEL
Chol/HDL Ratio: 1.9 ratio (ref 0.0–5.0)
Cholesterol, Total: 260 mg/dL — ABNORMAL HIGH (ref 100–199)
HDL: 139 mg/dL (ref >39)
LDL Chol Calc (NIH): 112 mg/dL — ABNORMAL HIGH (ref 0–99)
Triglycerides: 54 mg/dL (ref 0–149)
VLDL Cholesterol Cal: 9 mg/dL (ref 5–40)

## 2021-08-10 LAB — CBC WITH DIFFERENTIAL/PLATELET
Basophils Absolute: 0.1 10*3/uL (ref 0.0–0.2)
Basos: 1 %
EOS (ABSOLUTE): 0.1 10*3/uL (ref 0.0–0.4)
Eos: 3 %
Hematocrit: 46.9 % (ref 37.5–51.0)
Hemoglobin: 15.6 g/dL (ref 13.0–17.7)
Immature Grans (Abs): 0 10*3/uL (ref 0.0–0.1)
Immature Granulocytes: 0 %
Lymphocytes Absolute: 2.3 10*3/uL (ref 0.7–3.1)
Lymphs: 46 %
MCH: 26.7 pg (ref 26.6–33.0)
MCHC: 33.3 g/dL (ref 31.5–35.7)
MCV: 80 fL (ref 79–97)
Monocytes Absolute: 0.5 10*3/uL (ref 0.1–0.9)
Monocytes: 11 %
Neutrophils Absolute: 1.9 10*3/uL (ref 1.4–7.0)
Neutrophils: 39 %
Platelets: 277 10*3/uL (ref 150–450)
RBC: 5.85 x10E6/uL — ABNORMAL HIGH (ref 4.14–5.80)
RDW: 14.4 % (ref 11.6–15.4)
WBC: 4.9 10*3/uL (ref 3.4–10.8)

## 2021-08-10 NOTE — Telephone Encounter (Signed)
Pt was in yesterday for physical and he was having nocturia. Pt stated that while taking a supplement for prostate it helps. Pt would like to be referred to urology for  further evaluation. Please advise if this is ok. KH

## 2021-08-11 ENCOUNTER — Other Ambulatory Visit: Payer: Self-pay

## 2021-08-11 DIAGNOSIS — R351 Nocturia: Secondary | ICD-10-CM

## 2021-08-11 NOTE — Telephone Encounter (Signed)
Done and sent pt a my chart message. KH

## 2021-08-28 ENCOUNTER — Other Ambulatory Visit: Payer: Self-pay | Admitting: Family Medicine

## 2021-08-28 DIAGNOSIS — I10 Essential (primary) hypertension: Secondary | ICD-10-CM

## 2021-10-10 DIAGNOSIS — R351 Nocturia: Secondary | ICD-10-CM | POA: Diagnosis not present

## 2021-10-10 DIAGNOSIS — R350 Frequency of micturition: Secondary | ICD-10-CM | POA: Diagnosis not present

## 2021-10-10 DIAGNOSIS — N401 Enlarged prostate with lower urinary tract symptoms: Secondary | ICD-10-CM | POA: Diagnosis not present

## 2021-10-10 DIAGNOSIS — R3912 Poor urinary stream: Secondary | ICD-10-CM | POA: Diagnosis not present

## 2022-03-08 ENCOUNTER — Encounter: Payer: Self-pay | Admitting: Internal Medicine

## 2022-03-13 ENCOUNTER — Other Ambulatory Visit: Payer: Self-pay | Admitting: Family Medicine

## 2022-03-13 DIAGNOSIS — I10 Essential (primary) hypertension: Secondary | ICD-10-CM

## 2022-04-11 ENCOUNTER — Encounter: Payer: Self-pay | Admitting: Internal Medicine

## 2022-04-24 ENCOUNTER — Encounter: Payer: Self-pay | Admitting: Internal Medicine

## 2022-06-21 ENCOUNTER — Encounter: Payer: Self-pay | Admitting: Internal Medicine

## 2022-08-15 ENCOUNTER — Encounter: Payer: BC Managed Care – PPO | Admitting: Family Medicine

## 2022-08-17 ENCOUNTER — Encounter: Payer: Self-pay | Admitting: Family Medicine

## 2022-08-17 ENCOUNTER — Ambulatory Visit (INDEPENDENT_AMBULATORY_CARE_PROVIDER_SITE_OTHER): Payer: 59 | Admitting: Family Medicine

## 2022-08-17 VITALS — BP 132/88 | HR 64 | Temp 97.9°F | Resp 16 | Ht 70.5 in | Wt 221.0 lb

## 2022-08-17 DIAGNOSIS — I10 Essential (primary) hypertension: Secondary | ICD-10-CM | POA: Diagnosis not present

## 2022-08-17 DIAGNOSIS — N529 Male erectile dysfunction, unspecified: Secondary | ICD-10-CM

## 2022-08-17 DIAGNOSIS — N401 Enlarged prostate with lower urinary tract symptoms: Secondary | ICD-10-CM | POA: Diagnosis not present

## 2022-08-17 DIAGNOSIS — R3911 Hesitancy of micturition: Secondary | ICD-10-CM | POA: Diagnosis not present

## 2022-08-17 DIAGNOSIS — R7989 Other specified abnormal findings of blood chemistry: Secondary | ICD-10-CM

## 2022-08-17 MED ORDER — SILDENAFIL CITRATE 100 MG PO TABS: 50.0000 mg | ORAL_TABLET | Freq: Every day | ORAL | 5 refills | Status: AC | PRN

## 2022-08-17 MED ORDER — FINASTERIDE 5 MG PO TABS: 5.0000 mg | ORAL_TABLET | Freq: Every day | ORAL | 3 refills | Status: DC

## 2022-08-17 NOTE — Progress Notes (Signed)
   Complete physical exam  Patient: Curtis Wallace.   DOB: 1966/04/23   57 y.o. Male  MRN: [id number]  Subjective:    Chief Complaint  Patient presents with   Annual Exam    Curtis Wallace. is a 57 y.o. male who presents today for a complete physical exam however he does not have insurance but does plan to get it within the next month or so.  Presently he is taking amlodipine for his blood pressure and Flomax.  He does note decreased stream and hesitancy and spite of the Flomax.  Does have a previous history of BPH.  He also occasionally will take Viagra.  Most recent fall risk assessment:    08/17/2022    3:20 PM  [place] in the past year? 1  Number falls in past yr: 0  Injury with Fall? 1  Risk for fall due to : No Fall Risks  Follow up Falls evaluation completed     Most recent depression screenings:    08/17/2022    3:20 PM 08/09/2021    2:48 PM  PHQ 2/9 Scores  PHQ - 2 Score 0 0    Vision:Not within last year  and Dental: No regular dental care     Patient Care Team: Denita Lung, MD as PCP - General (Family Medicine) Sueanne Margarita, MD as PCP - Cardiology (Cardiology)   Outpatient Medications Prior to Visit  Medication Sig   amLODipine (NORVASC) 5 MG tablet TAKE 1 TABLET BY MOUTH DAILY. MAY TAKE AN EXTRA TABLET DAILY AS NEEDED FOR ELEVATED BLOOD PRESSURE   tamsulosin (FLOMAX) 0.4 MG CAPS capsule Take 0.4 mg by mouth daily.   No facility-administered medications prior to visit.    ROS        Objective:  BP 132/88   Pulse 64   Temp 97.9 F (36.6 C) (Oral)   Resp 16   Ht 5' 10.5" (1.791 m)   Wt 221 lb (100.2 kg)   SpO2 96% Comment: room air  BMI 31.26 kg/m    Physical Exam  Alert and in no distress otherwise not examined     Assessment & Plan:   Essential hypertension, benign  Erectile dysfunction, unspecified erectile dysfunction type - Plan: sildenafil (VIAGRA) 100 MG tablet  Benign prostatic hyperplasia with urinary  hesitancy - Plan: finasteride (PROSCAR) 5 MG tablet   I explained that I will wait till he gets insurance to do more complete exam.  Will add Proscar to his regimen to see if that will help with his BPH symptoms. Return in about 4 months (around 12/16/2022). For complete exam    Jill Alexanders, MD

## 2022-08-24 ENCOUNTER — Encounter: Payer: BC Managed Care – PPO | Admitting: Family Medicine

## 2022-10-28 IMAGING — DX DG KNEE COMPLETE 4+V*L*
4 series · 4 of 4 positions shown · non-contrast
Comparison: None.

CLINICAL DATA: Left knee pain and swelling. Pain with running.

EXAM:
LEFT KNEE - COMPLETE 4+ VIEW

[dg knee complete 4 views left (1 of 4)]
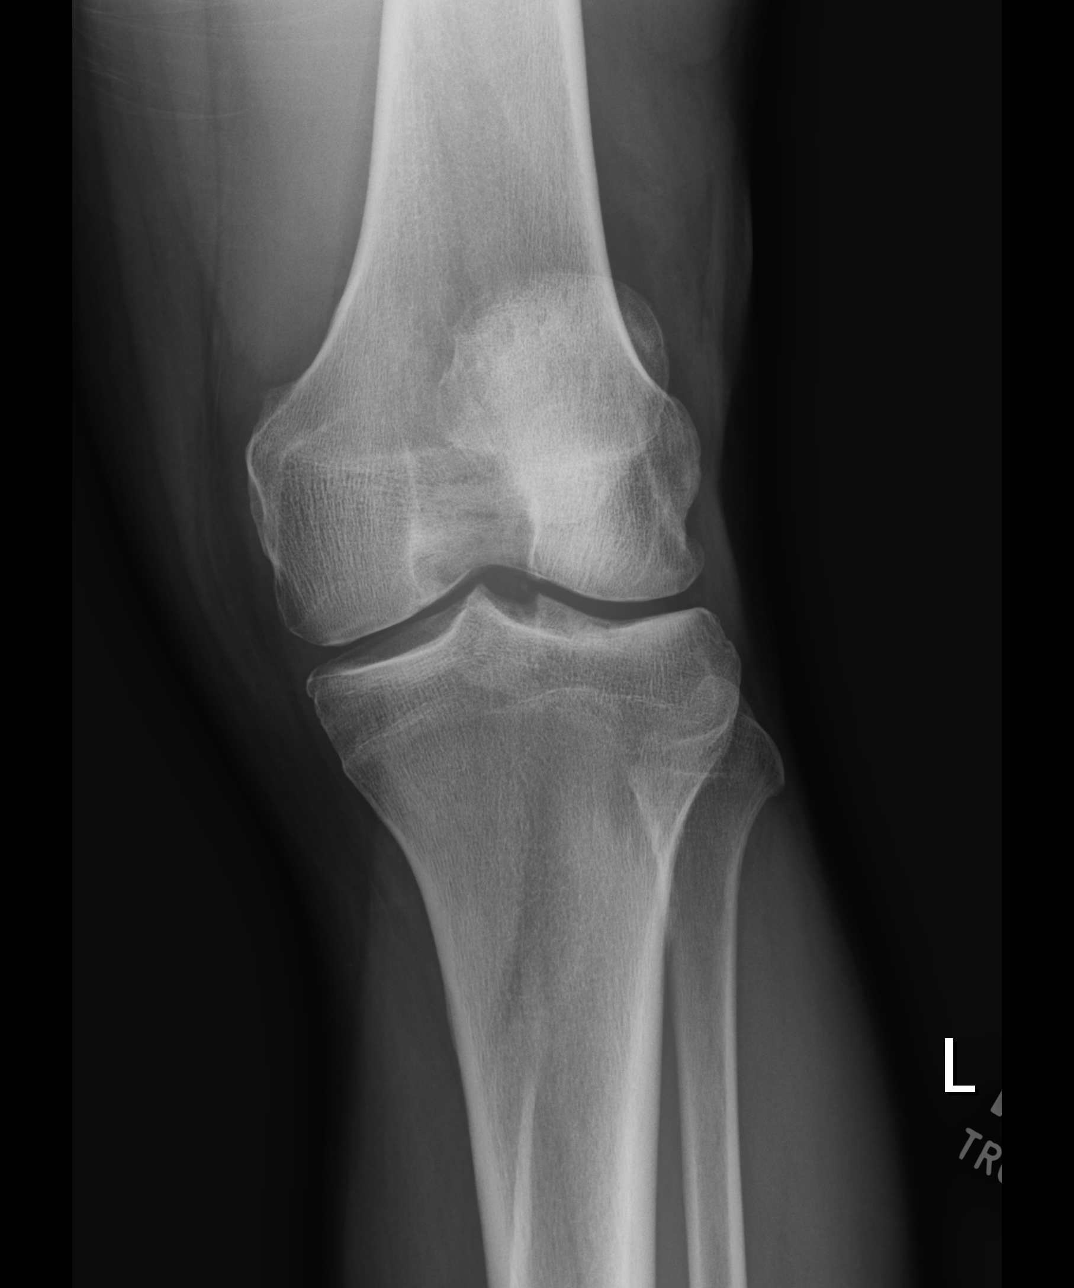

[dg knee complete 4 views left (2 of 4)]
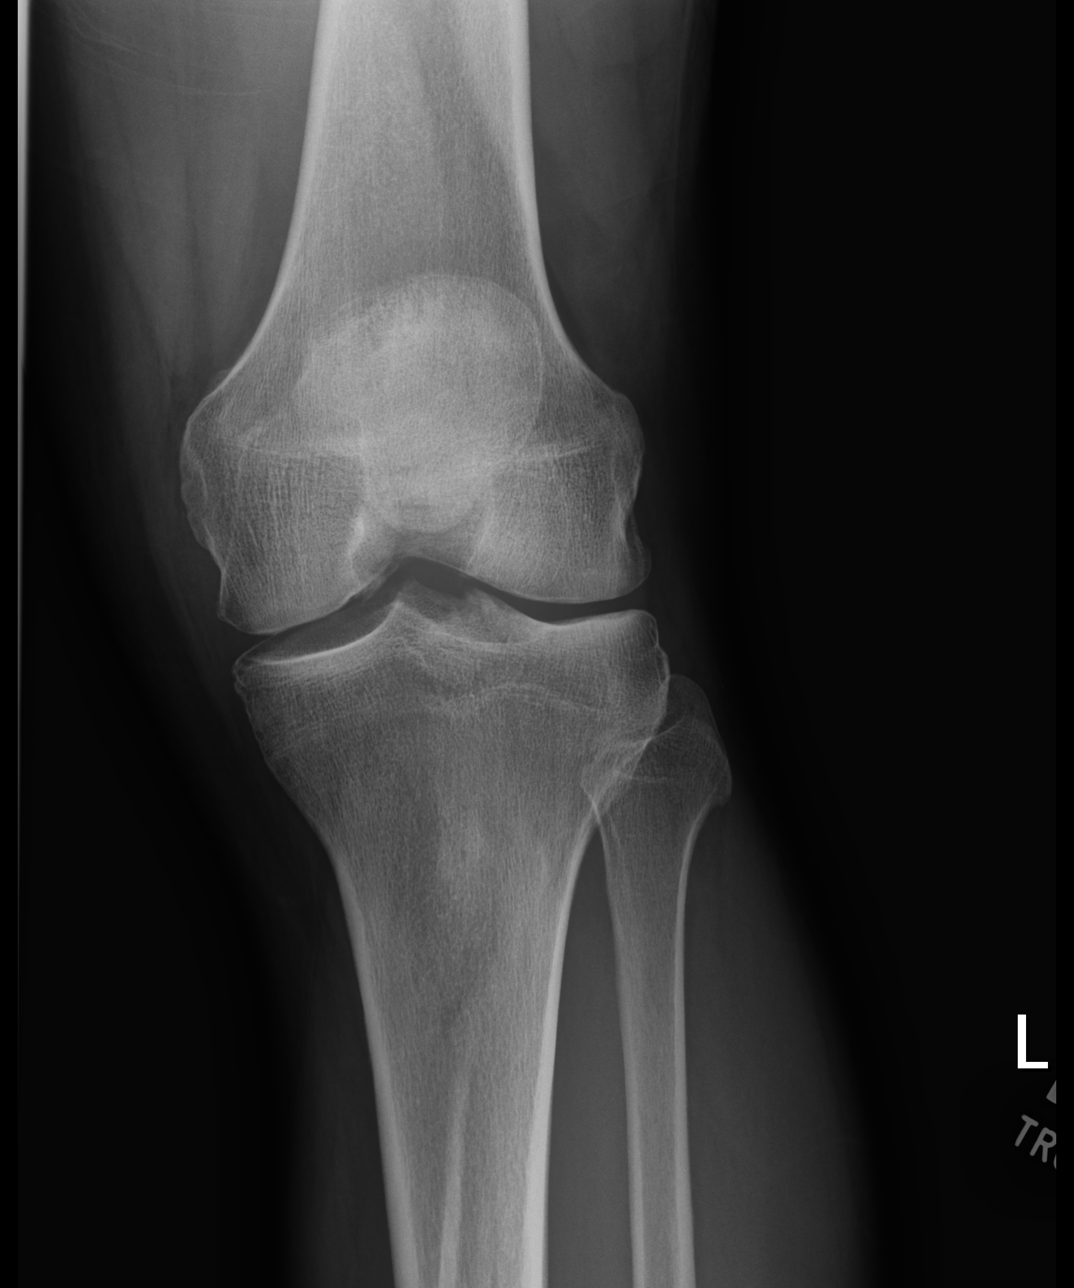

[dg knee complete 4 views left (3 of 4)]
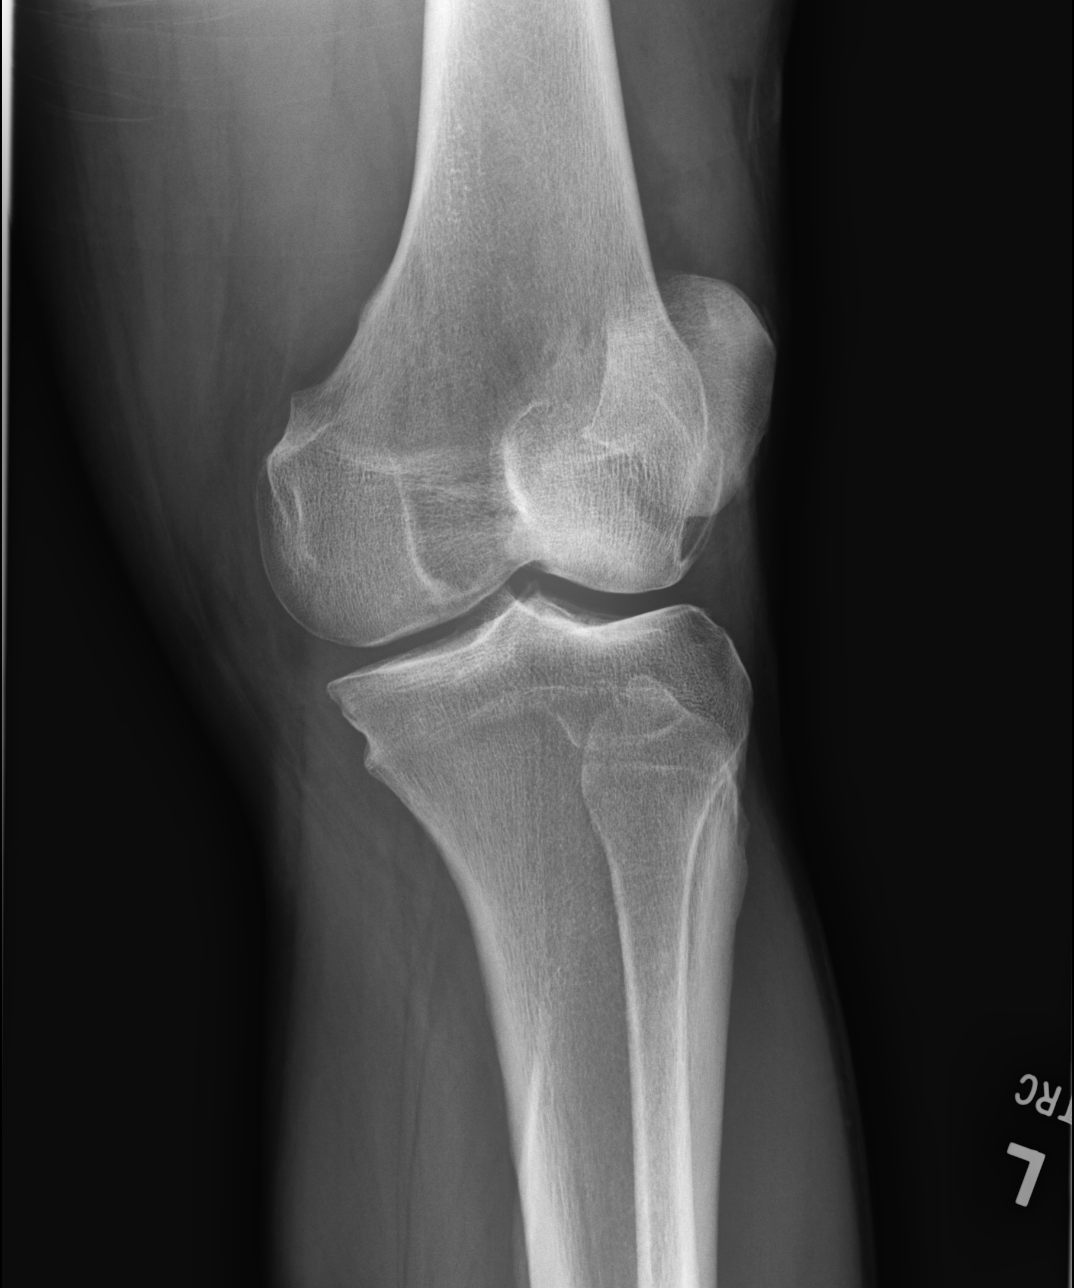

[dg knee complete 4 views left (4 of 4)]
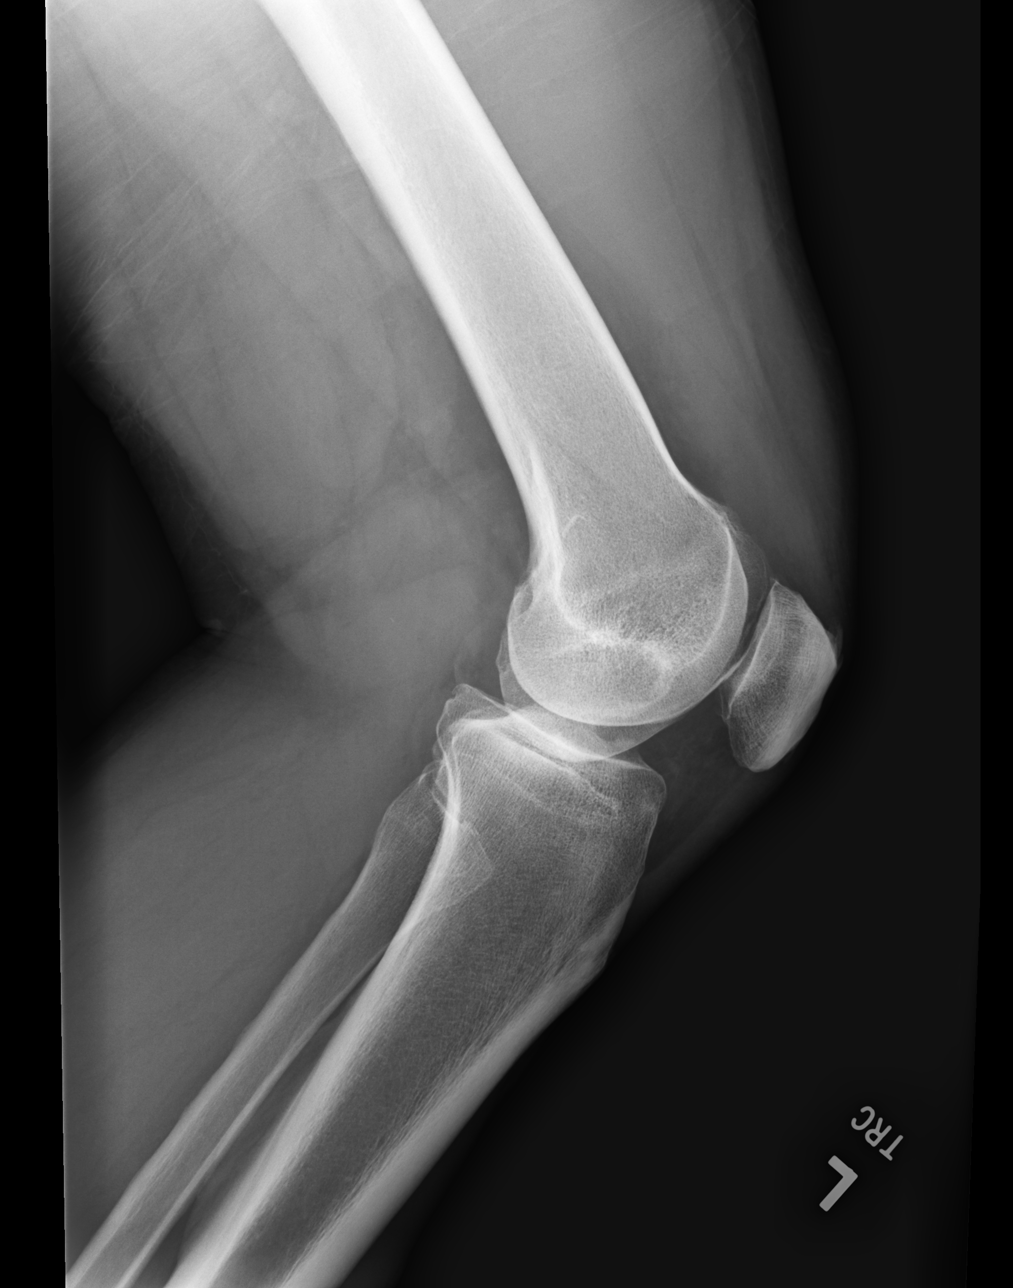

[4 of 4 positions shown; findings below may reference images not displayed]

FINDINGS: Joint spaces are preserved. A small effusion is present. No acute
focal abnormality is present.
IMPRESSION: 1. Small joint effusion, likely inflammatory.
2. No acute osseous abnormality.

## 2023-01-30 ENCOUNTER — Other Ambulatory Visit: Payer: Self-pay | Admitting: Family Medicine

## 2023-01-30 DIAGNOSIS — I10 Essential (primary) hypertension: Secondary | ICD-10-CM

## 2023-08-28 ENCOUNTER — Encounter: Payer: Self-pay | Admitting: Internal Medicine

## 2023-08-31 ENCOUNTER — Other Ambulatory Visit: Payer: Self-pay | Admitting: Family Medicine

## 2023-08-31 DIAGNOSIS — I10 Essential (primary) hypertension: Secondary | ICD-10-CM

## 2023-09-14 ENCOUNTER — Other Ambulatory Visit: Payer: Self-pay | Admitting: Family Medicine

## 2023-09-14 DIAGNOSIS — I10 Essential (primary) hypertension: Secondary | ICD-10-CM

## 2023-09-14 NOTE — Telephone Encounter (Signed)
 Patient is due for a visit. Once scheduled we can refill to get him to his appt. Last seen 08/2022

## 2023-10-16 ENCOUNTER — Encounter: Payer: Self-pay | Admitting: Family Medicine

## 2023-10-16 ENCOUNTER — Ambulatory Visit: Admitting: Family Medicine

## 2023-10-16 VITALS — BP 134/80 | HR 77 | Wt 229.2 lb

## 2023-10-16 DIAGNOSIS — Z0000 Encounter for general adult medical examination without abnormal findings: Secondary | ICD-10-CM | POA: Diagnosis not present

## 2023-10-16 DIAGNOSIS — Z1211 Encounter for screening for malignant neoplasm of colon: Secondary | ICD-10-CM

## 2023-10-16 DIAGNOSIS — R3911 Hesitancy of micturition: Secondary | ICD-10-CM

## 2023-10-16 DIAGNOSIS — N401 Enlarged prostate with lower urinary tract symptoms: Secondary | ICD-10-CM | POA: Diagnosis not present

## 2023-10-16 DIAGNOSIS — Z2821 Immunization not carried out because of patient refusal: Secondary | ICD-10-CM

## 2023-10-16 DIAGNOSIS — R739 Hyperglycemia, unspecified: Secondary | ICD-10-CM | POA: Diagnosis not present

## 2023-10-16 DIAGNOSIS — E782 Mixed hyperlipidemia: Secondary | ICD-10-CM | POA: Diagnosis not present

## 2023-10-16 DIAGNOSIS — I10 Essential (primary) hypertension: Secondary | ICD-10-CM | POA: Diagnosis not present

## 2023-10-16 LAB — COMPREHENSIVE METABOLIC PANEL WITH GFR
ALT: 20 IU/L (ref 0–44)
AST: 26 IU/L (ref 0–40)
Albumin: 4.5 g/dL (ref 3.8–4.9)
Alkaline Phosphatase: 64 IU/L (ref 44–121)
BUN/Creatinine Ratio: 10 (ref 9–20)
BUN: 14 mg/dL (ref 6–24)
Bilirubin Total: 0.5 mg/dL (ref 0.0–1.2)
CO2: 21 mmol/L (ref 20–29)
Calcium: 9.4 mg/dL (ref 8.7–10.2)
Chloride: 101 mmol/L (ref 96–106)
Creatinine, Ser: 1.44 mg/dL — ABNORMAL HIGH (ref 0.76–1.27)
Globulin, Total: 2.9 g/dL (ref 1.5–4.5)
Glucose: 113 mg/dL — ABNORMAL HIGH (ref 70–99)
Potassium: 4 mmol/L (ref 3.5–5.2)
Sodium: 138 mmol/L (ref 134–144)
Total Protein: 7.4 g/dL (ref 6.0–8.5)
eGFR: 56 mL/min/{1.73_m2} — ABNORMAL LOW (ref >59)

## 2023-10-16 LAB — CBC WITH DIFFERENTIAL/PLATELET
Basophils Absolute: 0.1 10*3/uL (ref 0.0–0.2)
Basos: 1 %
EOS (ABSOLUTE): 0.2 10*3/uL (ref 0.0–0.4)
Eos: 3 %
Hematocrit: 43.4 % (ref 37.5–51.0)
Hemoglobin: 15.1 g/dL (ref 13.0–17.7)
Immature Grans (Abs): 0 10*3/uL (ref 0.0–0.1)
Immature Granulocytes: 0 %
Lymphocytes Absolute: 2.1 10*3/uL (ref 0.7–3.1)
Lymphs: 40 %
MCH: 27.7 pg (ref 26.6–33.0)
MCHC: 34.8 g/dL (ref 31.5–35.7)
MCV: 80 fL (ref 79–97)
Monocytes Absolute: 0.5 10*3/uL (ref 0.1–0.9)
Monocytes: 9 %
Neutrophils Absolute: 2.4 10*3/uL (ref 1.4–7.0)
Neutrophils: 47 %
Platelets: 273 10*3/uL (ref 150–450)
RBC: 5.45 x10E6/uL (ref 4.14–5.80)
RDW: 13 % (ref 11.6–15.4)
WBC: 5.2 10*3/uL (ref 3.4–10.8)

## 2023-10-16 LAB — LIPID PANEL
Chol/HDL Ratio: 2.1 ratio (ref 0.0–5.0)
Cholesterol, Total: 239 mg/dL — ABNORMAL HIGH (ref 100–199)
HDL: 112 mg/dL (ref >39)
LDL Chol Calc (NIH): 119 mg/dL — ABNORMAL HIGH (ref 0–99)
Triglycerides: 51 mg/dL (ref 0–149)
VLDL Cholesterol Cal: 8 mg/dL (ref 5–40)

## 2023-10-16 MED ORDER — FINASTERIDE 5 MG PO TABS: 5.0000 mg | ORAL_TABLET | Freq: Every day | ORAL | 3 refills | Status: AC

## 2023-10-16 MED ORDER — TAMSULOSIN HCL 0.4 MG PO CAPS
0.4000 mg | ORAL_CAPSULE | Freq: Every day | ORAL | 3 refills | Status: AC
Start: 2023-10-16 — End: ?

## 2023-10-16 MED ORDER — AMLODIPINE BESYLATE 5 MG PO TABS: 5.0000 mg | ORAL_TABLET | Freq: Every day | ORAL | 3 refills | Status: AC

## 2023-10-16 NOTE — Progress Notes (Signed)
   Subjective:    Patient ID: Curtis Cutting., male    DOB: [birth year], 58 y.o.   MRN: [id number]  HPI He is here for complete examination.  He now has insurance.  He does need refills on several medications including amlodipine tamsulosin and Proscar.  He does not need Viagra at the present moment.  He is not having any difficulty with the present medication regimen.  His work seems to be going well.  He is married but they are apparently separated and still sharing the same house.  He has gotten counseling on this in the past and plans to do this again.  He seemed to be doing fairly well with this.  His 2 daughters are planning on getting married within the next year or 2. Discussed several immunizations that he needs but he is not interested.  Review of Systems  All other systems reviewed and are negative. Family and social history as well as health maintenance and immunizations was reviewed     Objective:    Physical Exam Alert and in no distress. Tympanic membranes and canals are normal. Pharyngeal area is normal. Neck is supple without adenopathy or thyromegaly. Cardiac exam shows a regular sinus rhythm without murmurs or gallops. Lungs are clear to auscultation.        Assessment & Plan:  Routine general medical examination at a health care facility  Immunization refused  Screening for colon cancer - Plan: Cologuard  Essential hypertension, benign - Plan: CBC with Differential/Platelet, Comprehensive metabolic panel with GFR, amLODipine (NORVASC) 5 MG tablet  Benign prostatic hyperplasia with urinary hesitancy - Plan: finasteride (PROSCAR) 5 MG tablet, tamsulosin (FLOMAX) 0.4 MG CAPS capsule  Mixed hyperlipidemia - Plan: Lipid panel Continue present medication regimen.  Also encouraged him to stay involved in counseling to help deal with his present life situation which at the present moment he seems to be handling fairly well although there seems to be a communication  issue with him wanting to discuss things and his wife shutting down.

## 2023-10-17 ENCOUNTER — Encounter: Payer: Self-pay | Admitting: Family Medicine

## 2023-10-22 LAB — HGB A1C W/O EAG: Hgb A1c MFr Bld: 5.9 % — ABNORMAL HIGH (ref 4.8–5.6)

## 2023-10-22 LAB — SPECIMEN STATUS REPORT

## 2023-12-16 DIAGNOSIS — Z1211 Encounter for screening for malignant neoplasm of colon: Secondary | ICD-10-CM | POA: Diagnosis not present

## 2023-12-23 LAB — COLOGUARD: COLOGUARD: NEGATIVE

## 2023-12-24 ENCOUNTER — Ambulatory Visit: Payer: Self-pay | Admitting: Family Medicine
